# Patient Record
Sex: Male | Born: 1935
Health system: Southern US, Community
[De-identification: ages and names within clinical notes are randomized; demographics above are authoritative.]

## PROBLEM LIST (undated history)

## (undated) DIAGNOSIS — I10 Essential (primary) hypertension: Secondary | ICD-10-CM

## (undated) DIAGNOSIS — Z8673 Personal history of transient ischemic attack (TIA), and cerebral infarction without residual deficits: Secondary | ICD-10-CM

## (undated) DIAGNOSIS — E785 Hyperlipidemia, unspecified: Secondary | ICD-10-CM

## (undated) DIAGNOSIS — I779 Disorder of arteries and arterioles, unspecified: Secondary | ICD-10-CM

## (undated) DIAGNOSIS — C649 Malignant neoplasm of unspecified kidney, except renal pelvis: Secondary | ICD-10-CM

## (undated) DIAGNOSIS — I2510 Atherosclerotic heart disease of native coronary artery without angina pectoris: Secondary | ICD-10-CM

## (undated) HISTORY — PX: CORONARY ARTERY BYPASS GRAFT: SHX141

## (undated) HISTORY — DX: Disorder of arteries and arterioles, unspecified: I77.9

## (undated) HISTORY — PX: CAROTID ENDARTERECTOMY: SUR193

## (undated) HISTORY — DX: Essential (primary) hypertension: I10

## (undated) HISTORY — DX: Personal history of transient ischemic attack (TIA), and cerebral infarction without residual deficits: Z86.73

## (undated) HISTORY — DX: Hyperlipidemia, unspecified: E78.5

## (undated) HISTORY — DX: Atherosclerotic heart disease of native coronary artery without angina pectoris: I25.10

## (undated) HISTORY — DX: Malignant neoplasm of unspecified kidney, except renal pelvis: C64.9

## (undated) HISTORY — PX: NEPHRECTOMY: SHX65

---

## 1997-09-03 ENCOUNTER — Encounter (HOSPITAL_COMMUNITY): Admission: RE | Admit: 1997-09-03 | Discharge: 1997-12-02 | Payer: Self-pay | Admitting: *Deleted

## 1998-01-07 ENCOUNTER — Encounter (HOSPITAL_COMMUNITY): Admission: RE | Admit: 1998-01-07 | Discharge: 1998-04-07 | Payer: Self-pay | Admitting: *Deleted

## 2000-06-17 ENCOUNTER — Ambulatory Visit (HOSPITAL_COMMUNITY): Admission: RE | Admit: 2000-06-17 | Discharge: 2000-06-17 | Payer: Self-pay | Admitting: Family Medicine

## 2000-07-05 ENCOUNTER — Encounter: Payer: Self-pay | Admitting: Vascular Surgery

## 2000-07-07 ENCOUNTER — Inpatient Hospital Stay (HOSPITAL_COMMUNITY): Admission: RE | Admit: 2000-07-07 | Discharge: 2000-07-08 | Payer: Self-pay | Admitting: Vascular Surgery

## 2000-07-07 ENCOUNTER — Encounter (INDEPENDENT_AMBULATORY_CARE_PROVIDER_SITE_OTHER): Payer: Self-pay | Admitting: Specialist

## 2000-11-11 ENCOUNTER — Inpatient Hospital Stay (HOSPITAL_COMMUNITY): Admission: RE | Admit: 2000-11-11 | Discharge: 2000-11-15 | Payer: Self-pay | Admitting: Specialist

## 2003-05-24 ENCOUNTER — Inpatient Hospital Stay (HOSPITAL_COMMUNITY): Admission: EM | Admit: 2003-05-24 | Discharge: 2003-05-31 | Payer: Self-pay | Admitting: *Deleted

## 2003-06-03 ENCOUNTER — Inpatient Hospital Stay (HOSPITAL_COMMUNITY): Admission: RE | Admit: 2003-06-03 | Discharge: 2003-06-07 | Payer: Self-pay | Admitting: Urology

## 2003-06-04 ENCOUNTER — Encounter (INDEPENDENT_AMBULATORY_CARE_PROVIDER_SITE_OTHER): Payer: Self-pay | Admitting: Specialist

## 2003-09-13 ENCOUNTER — Ambulatory Visit (HOSPITAL_COMMUNITY): Admission: RE | Admit: 2003-09-13 | Discharge: 2003-09-13 | Payer: Self-pay | Admitting: General Surgery

## 2003-11-12 ENCOUNTER — Ambulatory Visit (HOSPITAL_COMMUNITY): Admission: RE | Admit: 2003-11-12 | Discharge: 2003-11-12 | Payer: Self-pay | Admitting: Urology

## 2003-12-03 ENCOUNTER — Ambulatory Visit: Admission: RE | Admit: 2003-12-03 | Discharge: 2003-12-25 | Payer: Self-pay | Admitting: Radiation Oncology

## 2004-02-24 ENCOUNTER — Ambulatory Visit: Admission: RE | Admit: 2004-02-24 | Discharge: 2004-05-08 | Payer: Self-pay | Admitting: Radiation Oncology

## 2004-03-03 ENCOUNTER — Encounter: Admission: RE | Admit: 2004-03-03 | Discharge: 2004-03-03 | Payer: Self-pay | Admitting: Urology

## 2004-03-30 ENCOUNTER — Ambulatory Visit (HOSPITAL_BASED_OUTPATIENT_CLINIC_OR_DEPARTMENT_OTHER): Admission: RE | Admit: 2004-03-30 | Discharge: 2004-03-30 | Payer: Self-pay

## 2004-03-30 ENCOUNTER — Ambulatory Visit (HOSPITAL_COMMUNITY): Admission: RE | Admit: 2004-03-30 | Discharge: 2004-03-30 | Payer: Self-pay

## 2004-04-20 ENCOUNTER — Ambulatory Visit: Admission: RE | Admit: 2004-04-20 | Discharge: 2004-04-20 | Payer: Self-pay | Admitting: Radiation Oncology

## 2004-08-29 ENCOUNTER — Ambulatory Visit (HOSPITAL_COMMUNITY): Admission: RE | Admit: 2004-08-29 | Discharge: 2004-08-29 | Payer: Self-pay | Admitting: Urology

## 2005-07-04 IMAGING — CR DG ABDOMEN ACUTE W/ 1V CHEST
3 series · 3 of 3 positions shown · non-contrast
Comparison: none

CLINICAL DATA: Right upper and lower abdominal pain and nausea and vomiting.
 ACUTE ABDOMEN WITH CHEST, 05/24/03
 Comparison chest dated 07/05/00.
 Stable borderline enlargement of the cardiac silhouette with a prominent left ventricular contour.  Stable mild prominence of the pulmonary vasculature and interstitial markings.  Stable mild elevation of the right hemidiaphragm.  Normal bowel gas pattern without free peritoneal air.  Mild scoliosis.  Lumbar spine degenerative changes.
 IMPRESSION 
 Stable borderline cardiomegaly, probable left ventricular hypertrophy, and mild pulmonary vascular congestion.
 Stable mild chronic interstitial lung disease and mild elevation of the right hemidiaphragm.
 No acute abnormality.

[view not recorded (1 of 3)]
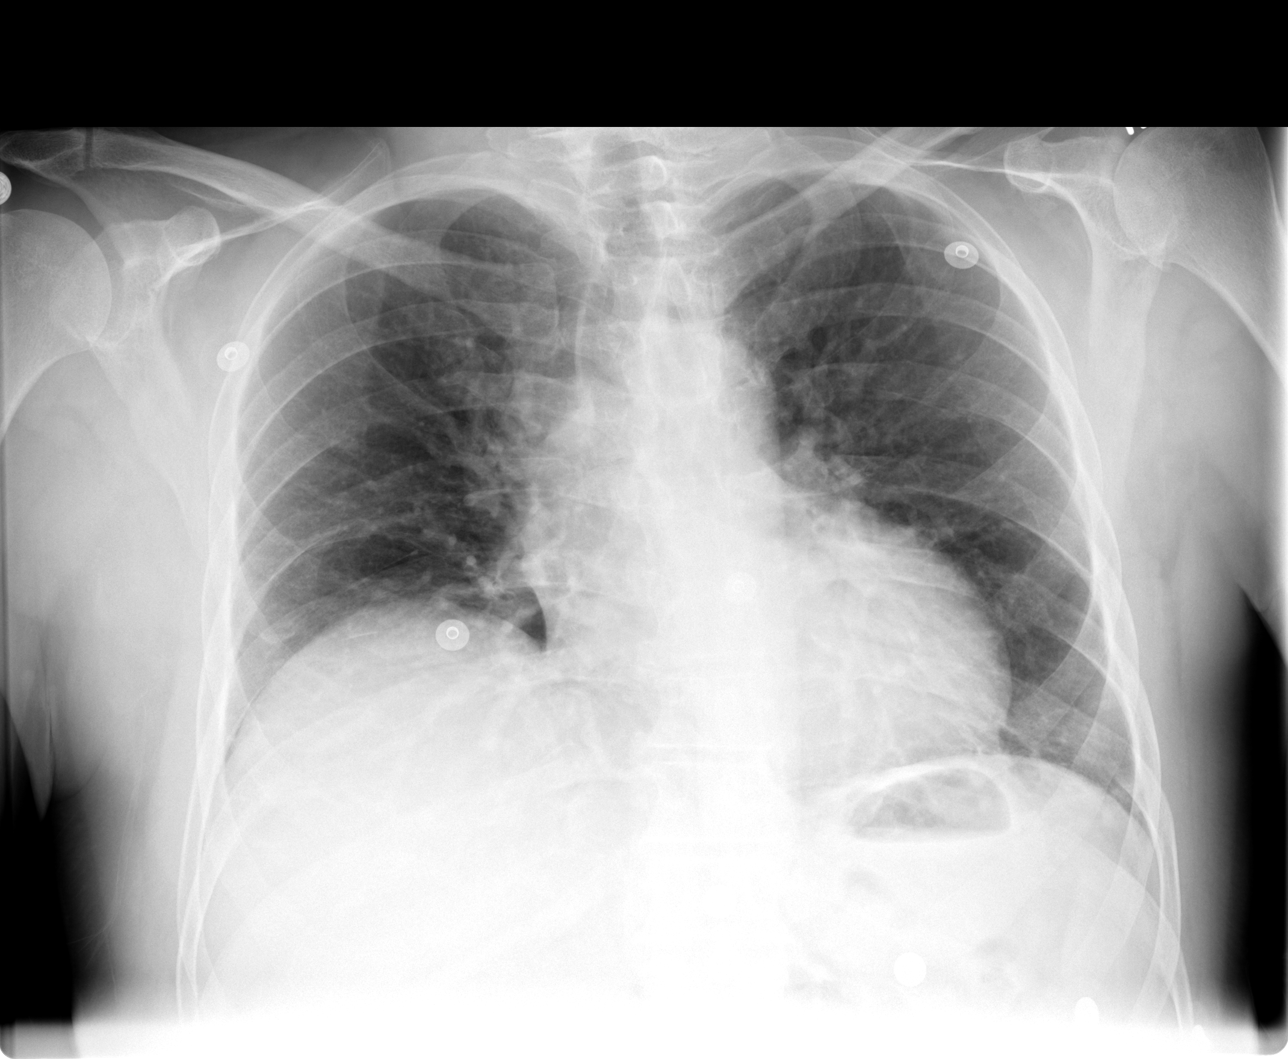

[view not recorded (2 of 3)]
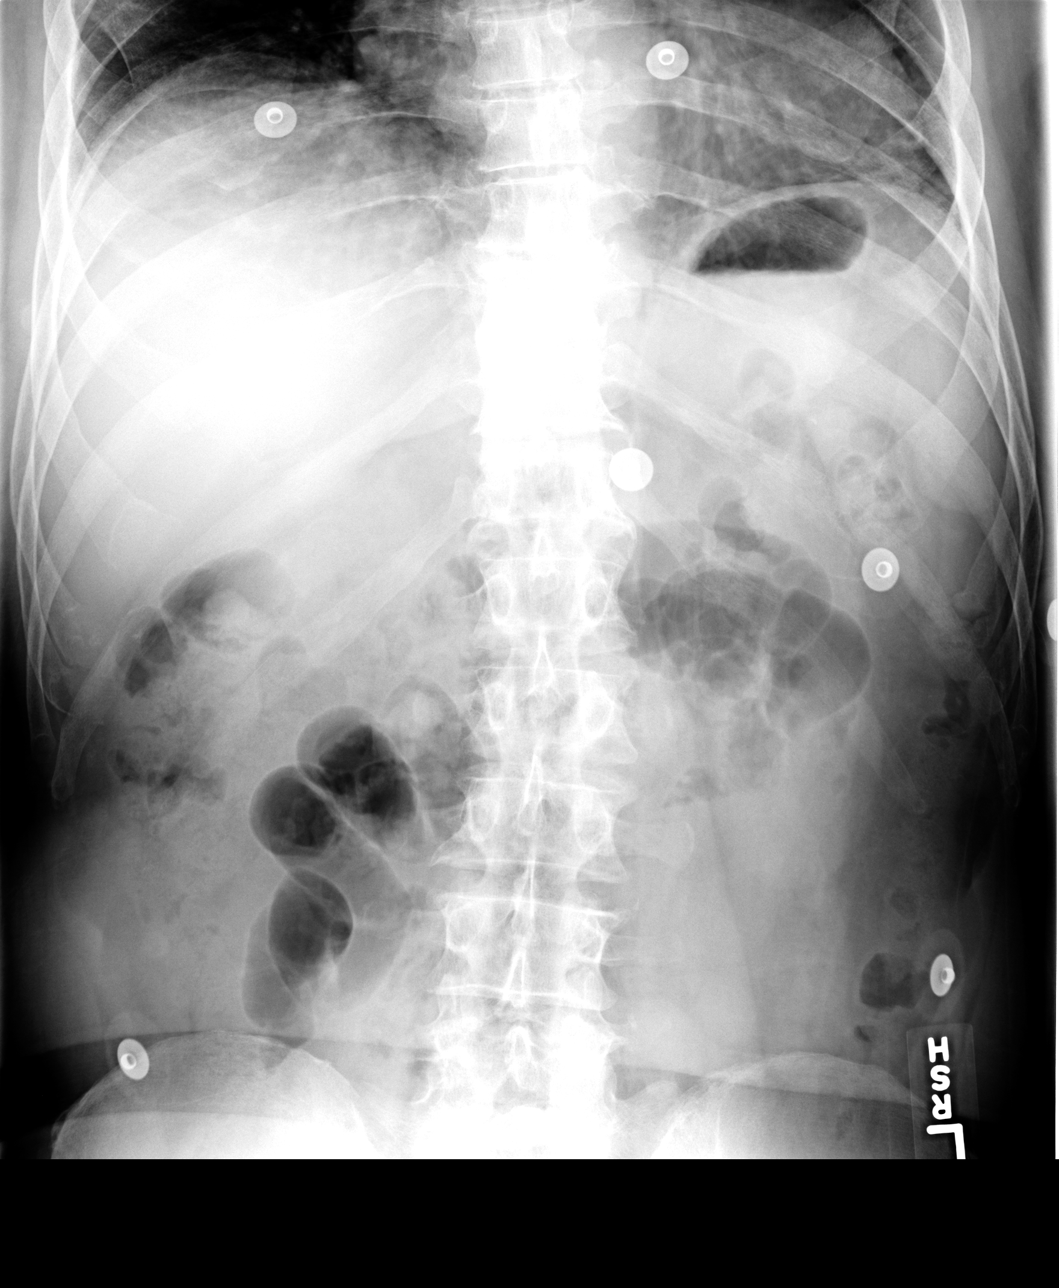

[view not recorded (3 of 3)]
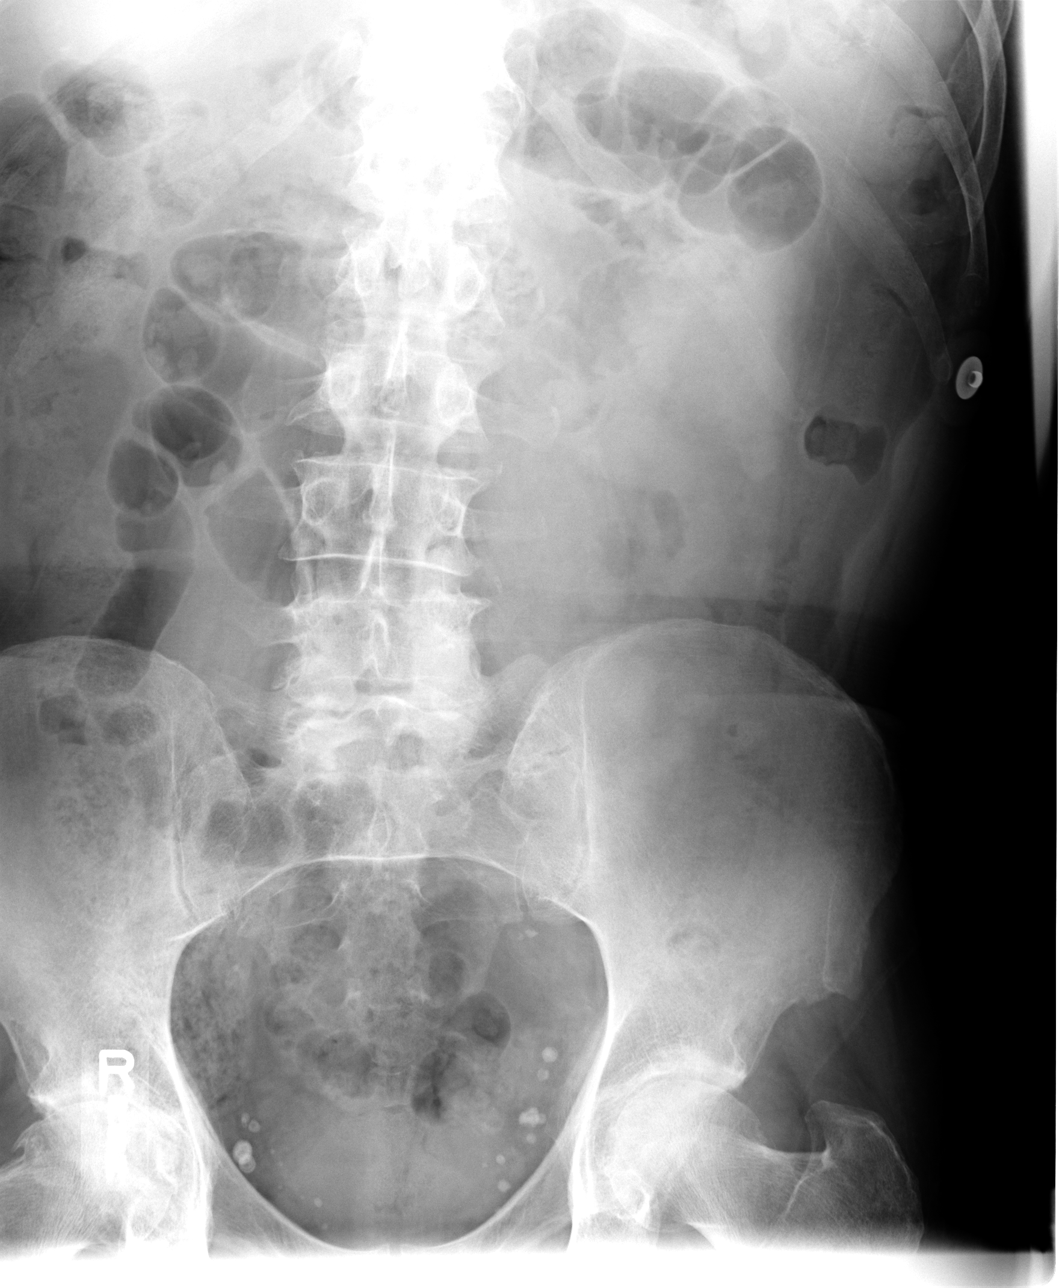

[3 of 3 positions shown; findings below may reference images not displayed]

## 2005-09-15 ENCOUNTER — Ambulatory Visit (HOSPITAL_COMMUNITY): Admission: RE | Admit: 2005-09-15 | Discharge: 2005-09-15 | Payer: Self-pay | Admitting: Urology

## 2008-03-15 ENCOUNTER — Inpatient Hospital Stay (HOSPITAL_COMMUNITY): Admission: EM | Admit: 2008-03-15 | Discharge: 2008-03-19 | Payer: Self-pay | Admitting: Emergency Medicine

## 2008-03-18 ENCOUNTER — Encounter: Payer: Self-pay | Admitting: Cardiology

## 2008-03-18 ENCOUNTER — Encounter (INDEPENDENT_AMBULATORY_CARE_PROVIDER_SITE_OTHER): Payer: Self-pay | Admitting: Internal Medicine

## 2008-04-01 ENCOUNTER — Encounter: Admission: RE | Admit: 2008-04-01 | Discharge: 2008-04-01 | Payer: Self-pay | Admitting: Family Medicine

## 2008-04-05 ENCOUNTER — Encounter: Payer: Self-pay | Admitting: Cardiovascular Disease

## 2008-04-08 ENCOUNTER — Ambulatory Visit (HOSPITAL_COMMUNITY): Admission: RE | Admit: 2008-04-08 | Discharge: 2008-04-08 | Payer: Self-pay | Admitting: Cardiovascular Disease

## 2008-04-09 ENCOUNTER — Ambulatory Visit: Payer: Self-pay | Admitting: Surgery

## 2008-04-15 ENCOUNTER — Encounter: Payer: Self-pay | Admitting: Surgery

## 2008-04-15 ENCOUNTER — Ambulatory Visit (HOSPITAL_COMMUNITY): Admission: RE | Admit: 2008-04-15 | Discharge: 2008-04-15 | Payer: Self-pay | Admitting: Surgery

## 2008-04-17 ENCOUNTER — Inpatient Hospital Stay (HOSPITAL_COMMUNITY): Admission: RE | Admit: 2008-04-17 | Discharge: 2008-04-22 | Payer: Self-pay | Admitting: Surgery

## 2008-04-17 ENCOUNTER — Ambulatory Visit: Payer: Self-pay | Admitting: Surgery

## 2008-04-26 ENCOUNTER — Encounter: Payer: Self-pay | Admitting: Cardiovascular Disease

## 2008-05-09 ENCOUNTER — Ambulatory Visit: Payer: Self-pay | Admitting: Surgery

## 2008-05-09 ENCOUNTER — Encounter: Admission: RE | Admit: 2008-05-09 | Discharge: 2008-05-09 | Payer: Self-pay | Admitting: Surgery

## 2009-10-21 ENCOUNTER — Ambulatory Visit: Payer: Self-pay | Admitting: Cardiovascular Disease

## 2010-05-01 ENCOUNTER — Ambulatory Visit: Payer: Self-pay | Admitting: Cardiovascular Disease

## 2010-05-27 IMAGING — CR DG CHEST 2V
2 series · 2 of 2 positions shown · non-contrast
Comparison: 09/15/2005

CLINICAL DATA: Coronary artery disease, hypertension, preoperative
assessment for CABG.

CHEST - 2 VIEW

[view not recorded (1 of 2)]
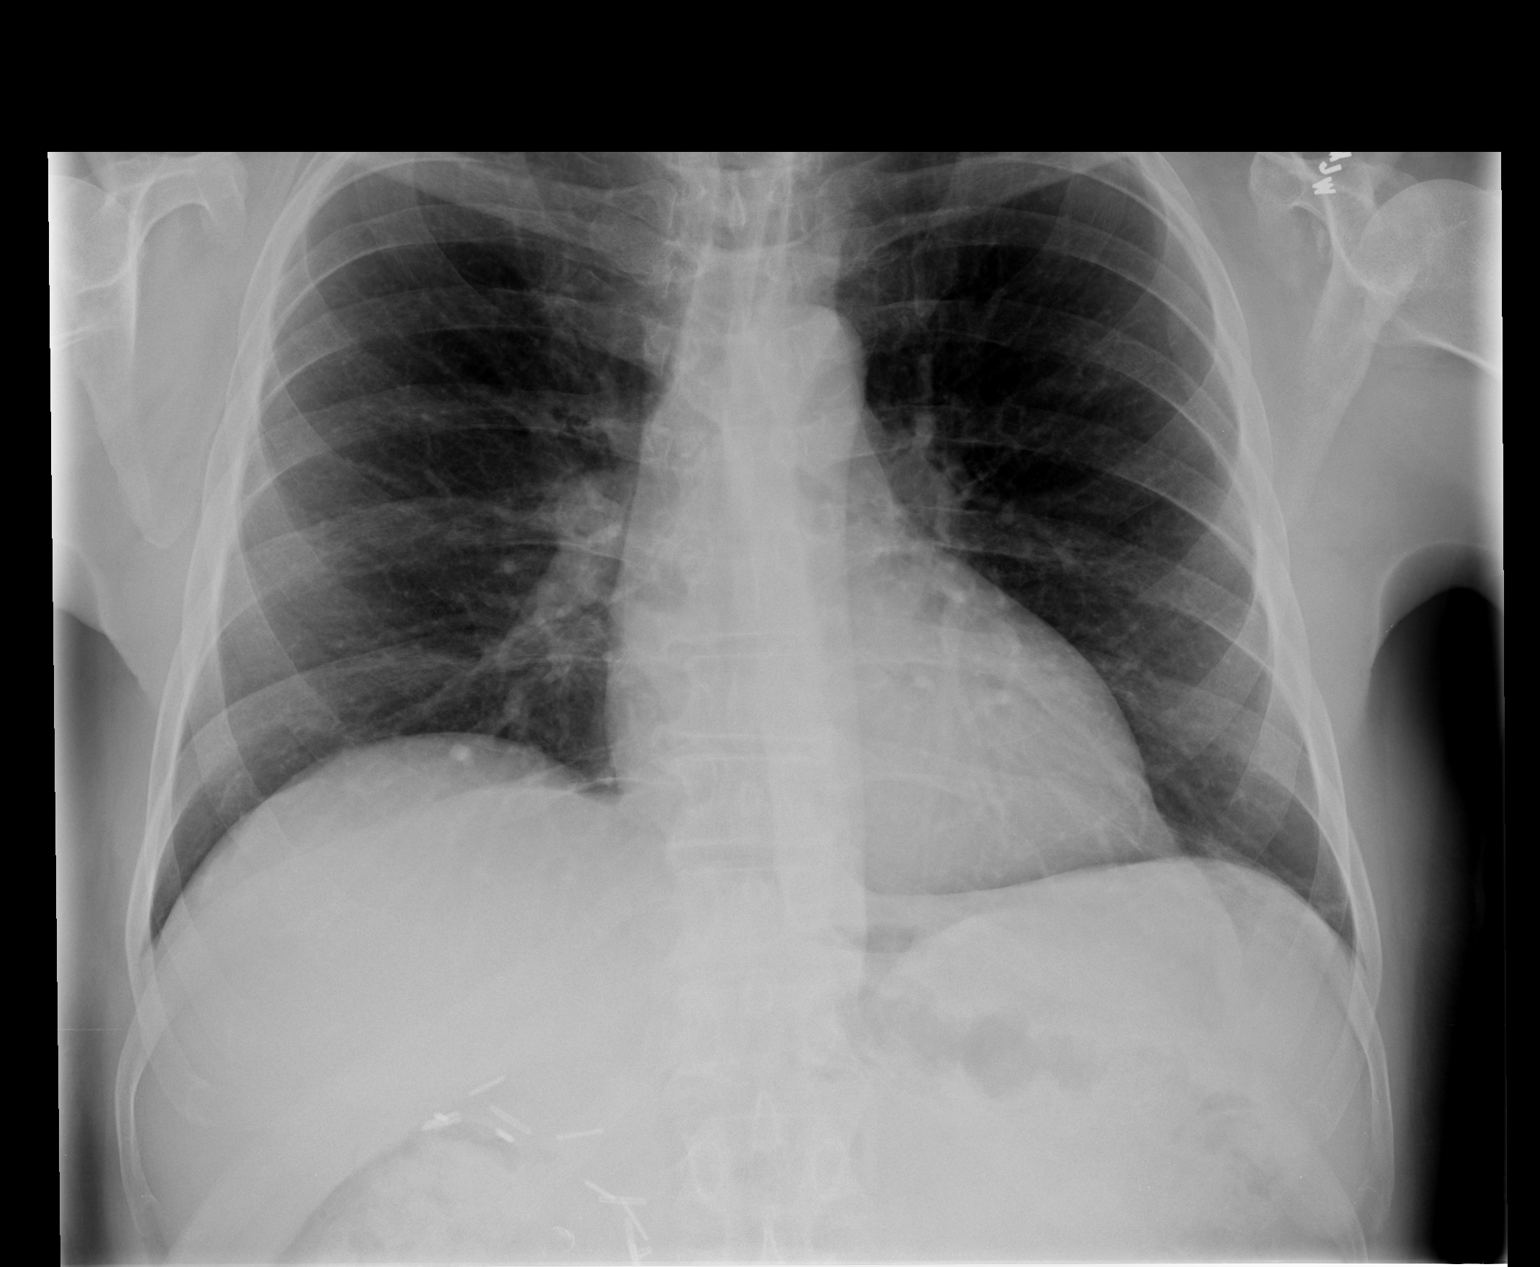

[view not recorded (2 of 2)]
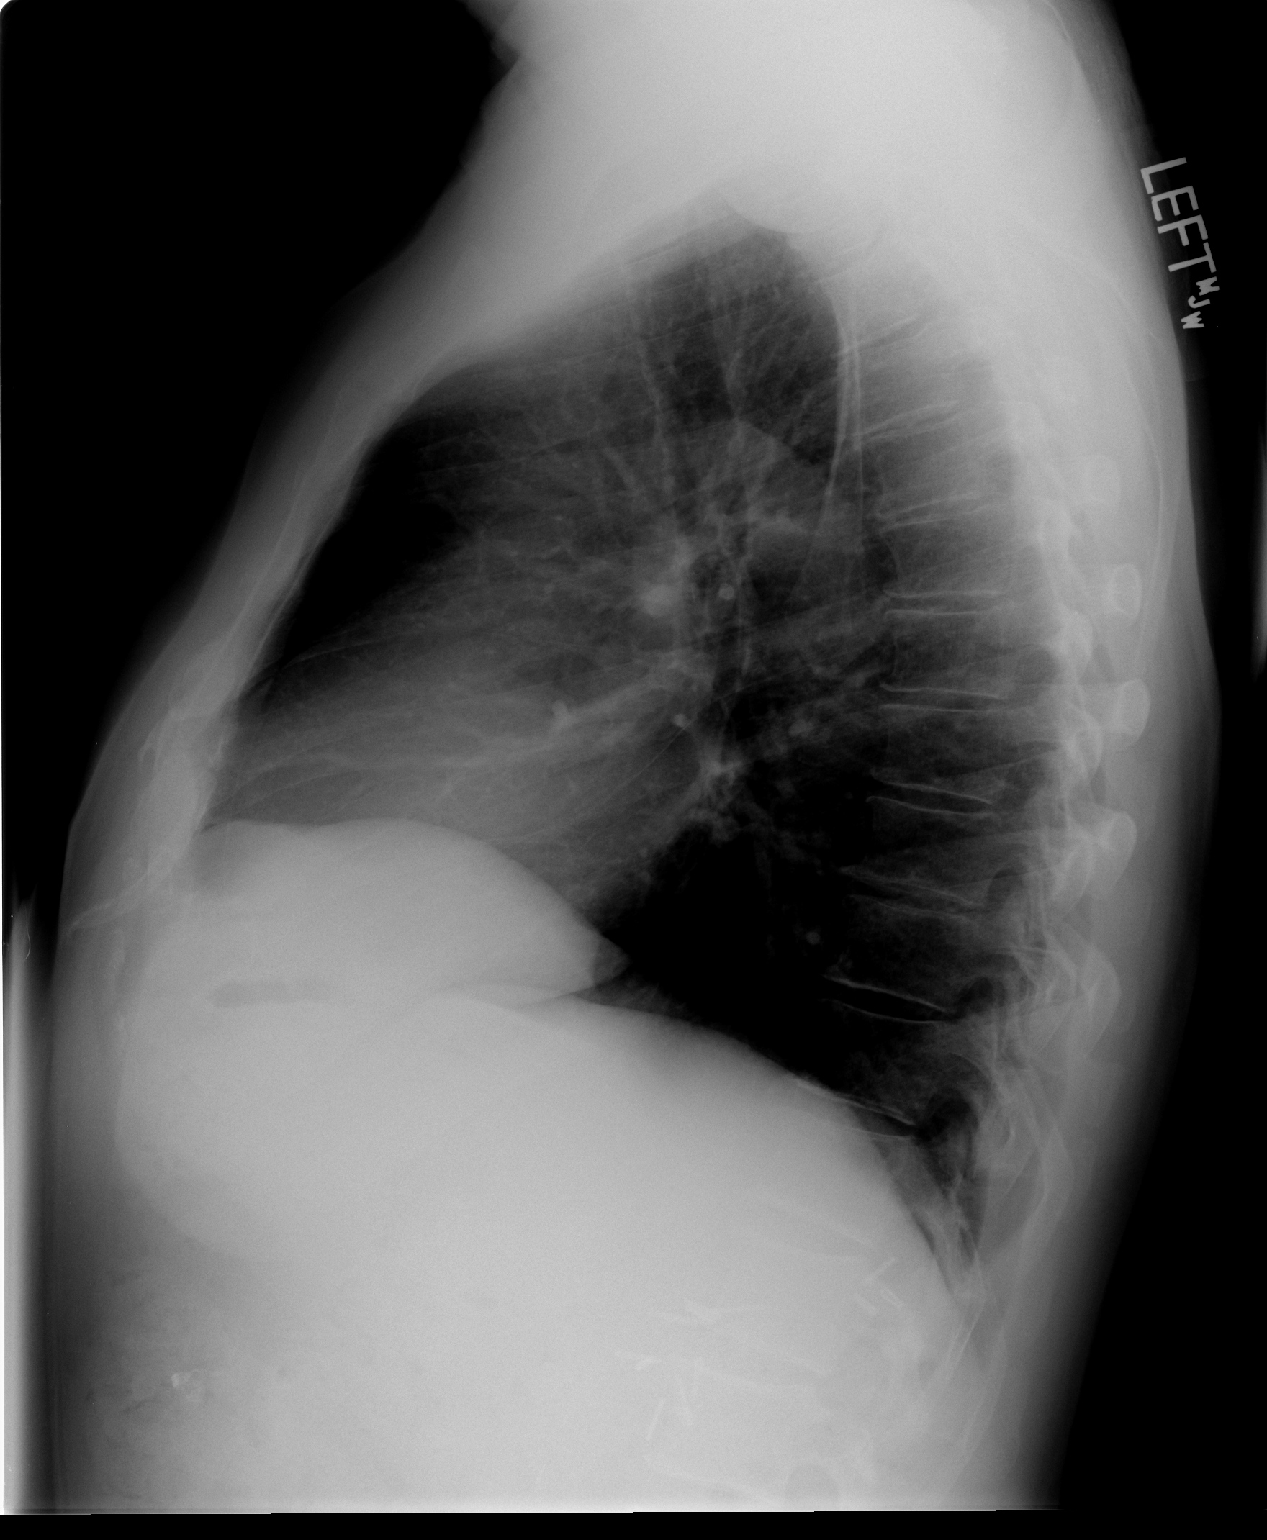

[2 of 2 positions shown; findings below may reference images not displayed]

FINDINGS: Normal heart size, mediastinal contours, and pulmonary vascularity.
Lungs clear.
No pleural effusion or pneumothorax.
Bones unremarkable.
IMPRESSION: No acute abnormalities.

## 2010-05-28 LAB — POCT I-STAT 4, (NA,K, GLUC, HGB,HCT)
Glucose, Bld: 110 mg/dL — ABNORMAL HIGH (ref 70–99)
Glucose, Bld: 95 mg/dL (ref 70–99)
HCT: 25 % — ABNORMAL LOW (ref 39.0–52.0)
HCT: 28 % — ABNORMAL LOW (ref 39.0–52.0)
HCT: 33 % — ABNORMAL LOW (ref 39.0–52.0)
HCT: 36 % — ABNORMAL LOW (ref 39.0–52.0)
Hemoglobin: 11.2 g/dL — ABNORMAL LOW (ref 13.0–17.0)
Hemoglobin: 12.2 g/dL — ABNORMAL LOW (ref 13.0–17.0)
Hemoglobin: 8.5 g/dL — ABNORMAL LOW (ref 13.0–17.0)
Hemoglobin: 9.5 g/dL — ABNORMAL LOW (ref 13.0–17.0)
Sodium: 139 mEq/L (ref 135–145)
Sodium: 141 mEq/L (ref 135–145)
Sodium: 142 mEq/L (ref 135–145)
Sodium: 144 mEq/L (ref 135–145)

## 2010-05-28 LAB — CBC
HCT: 23.8 % — ABNORMAL LOW (ref 39.0–52.0)
HCT: 26.4 % — ABNORMAL LOW (ref 39.0–52.0)
HCT: 27.3 % — ABNORMAL LOW (ref 39.0–52.0)
HCT: 27.9 % — ABNORMAL LOW (ref 39.0–52.0)
HCT: 33.2 % — ABNORMAL LOW (ref 39.0–52.0)
Hemoglobin: 11.3 g/dL — ABNORMAL LOW (ref 13.0–17.0)
Hemoglobin: 8.3 g/dL — ABNORMAL LOW (ref 13.0–17.0)
Hemoglobin: 9.1 g/dL — ABNORMAL LOW (ref 13.0–17.0)
Hemoglobin: 9.4 g/dL — ABNORMAL LOW (ref 13.0–17.0)
Hemoglobin: 9.7 g/dL — ABNORMAL LOW (ref 13.0–17.0)
Hemoglobin: 9.9 g/dL — ABNORMAL LOW (ref 13.0–17.0)
MCHC: 34 g/dL (ref 30.0–36.0)
MCHC: 34.6 g/dL (ref 30.0–36.0)
MCHC: 34.6 g/dL (ref 30.0–36.0)
MCHC: 34.6 g/dL (ref 30.0–36.0)
MCHC: 34.7 g/dL (ref 30.0–36.0)
MCV: 91.6 fL (ref 78.0–100.0)
MCV: 91.7 fL (ref 78.0–100.0)
MCV: 92.3 fL (ref 78.0–100.0)
MCV: 92.6 fL (ref 78.0–100.0)
MCV: 92.9 fL (ref 78.0–100.0)
MCV: 93.7 fL (ref 78.0–100.0)
MCV: 94.1 fL (ref 78.0–100.0)
Platelets: 120 10*3/uL — ABNORMAL LOW (ref 150–400)
Platelets: 134 10*3/uL — ABNORMAL LOW (ref 150–400)
Platelets: 195 10*3/uL (ref 150–400)
RBC: 2.88 MIL/uL — ABNORMAL LOW (ref 4.22–5.81)
RBC: 2.95 MIL/uL — ABNORMAL LOW (ref 4.22–5.81)
RBC: 3.05 MIL/uL — ABNORMAL LOW (ref 4.22–5.81)
RBC: 3.06 MIL/uL — ABNORMAL LOW (ref 4.22–5.81)
RBC: 3.52 MIL/uL — ABNORMAL LOW (ref 4.22–5.81)
RDW: 14.3 % (ref 11.5–15.5)
RDW: 14.6 % (ref 11.5–15.5)
RDW: 14.6 % (ref 11.5–15.5)
RDW: 14.6 % (ref 11.5–15.5)
RDW: 14.7 % (ref 11.5–15.5)
RDW: 15.5 % (ref 11.5–15.5)
WBC: 7.1 10*3/uL (ref 4.0–10.5)
WBC: 9.4 10*3/uL (ref 4.0–10.5)
WBC: 9.9 10*3/uL (ref 4.0–10.5)

## 2010-05-28 LAB — CREATININE, SERUM
Creatinine, Ser: 1.07 mg/dL (ref 0.4–1.5)
GFR calc Af Amer: 51 mL/min — ABNORMAL LOW (ref >60)
GFR calc Af Amer: >60 mL/min (ref >60)
GFR calc non Af Amer: >60 mL/min (ref >60)

## 2010-05-28 LAB — POCT I-STAT 3, ART BLOOD GAS (G3+)
Acid-base deficit: 3 mmol/L — ABNORMAL HIGH (ref 0.0–2.0)
Bicarbonate: 22.3 mEq/L (ref 20.0–24.0)
Bicarbonate: 23.2 mEq/L (ref 20.0–24.0)
O2 Saturation: 100 %
O2 Saturation: 100 %
O2 Saturation: 98 %
Patient temperature: 35.8
TCO2: 24 mmol/L (ref 0–100)
TCO2: 25 mmol/L (ref 0–100)
pCO2 arterial: 26.1 mmHg — ABNORMAL LOW (ref 35.0–45.0)
pCO2 arterial: 35.2 mmHg (ref 35.0–45.0)
pCO2 arterial: 36.4 mmHg (ref 35.0–45.0)
pH, Arterial: 7.363 (ref 7.350–7.450)
pH, Arterial: 7.397 (ref 7.350–7.450)
pH, Arterial: 7.445 (ref 7.350–7.450)
pO2, Arterial: 109 mmHg — ABNORMAL HIGH (ref 80.0–100.0)
pO2, Arterial: 174 mmHg — ABNORMAL HIGH (ref 80.0–100.0)
pO2, Arterial: 248 mmHg — ABNORMAL HIGH (ref 80.0–100.0)

## 2010-05-28 LAB — HEMOGLOBIN AND HEMATOCRIT, BLOOD
HCT: 23.4 % — ABNORMAL LOW (ref 39.0–52.0)
Hemoglobin: 8.3 g/dL — ABNORMAL LOW (ref 13.0–17.0)

## 2010-05-28 LAB — COMPREHENSIVE METABOLIC PANEL
ALT: 20 U/L (ref 0–53)
Alkaline Phosphatase: 62 U/L (ref 39–117)
BUN: 20 mg/dL (ref 6–23)
CO2: 23 mEq/L (ref 19–32)
Calcium: 9.3 mg/dL (ref 8.4–10.5)
GFR calc Af Amer: >60 mL/min (ref >60)
GFR calc non Af Amer: >60 mL/min (ref >60)
Glucose, Bld: 77 mg/dL (ref 70–99)
Sodium: 140 mEq/L (ref 135–145)

## 2010-05-28 LAB — MAGNESIUM
Magnesium: 2.9 mg/dL — ABNORMAL HIGH (ref 1.5–2.5)
Magnesium: 3.1 mg/dL — ABNORMAL HIGH (ref 1.5–2.5)

## 2010-05-28 LAB — BASIC METABOLIC PANEL
BUN: 21 mg/dL (ref 6–23)
CO2: 28 mEq/L (ref 19–32)
CO2: 29 mEq/L (ref 19–32)
CO2: 29 mEq/L (ref 19–32)
Calcium: 8.5 mg/dL (ref 8.4–10.5)
Chloride: 104 mEq/L (ref 96–112)
Chloride: 105 mEq/L (ref 96–112)
Chloride: 107 mEq/L (ref 96–112)
Chloride: 107 mEq/L (ref 96–112)
Creatinine, Ser: 1.2 mg/dL (ref 0.4–1.5)
GFR calc Af Amer: >60 mL/min (ref >60)
GFR calc Af Amer: >60 mL/min (ref >60)
GFR calc Af Amer: >60 mL/min (ref >60)
GFR calc non Af Amer: 57 mL/min — ABNORMAL LOW (ref >60)
GFR calc non Af Amer: 60 mL/min — ABNORMAL LOW (ref >60)
Glucose, Bld: 122 mg/dL — ABNORMAL HIGH (ref 70–99)
Glucose, Bld: 61 mg/dL — ABNORMAL LOW (ref 70–99)
Glucose, Bld: 77 mg/dL (ref 70–99)
Glucose, Bld: 80 mg/dL (ref 70–99)
Potassium: 3.6 mEq/L (ref 3.5–5.1)
Potassium: 3.8 mEq/L (ref 3.5–5.1)
Potassium: 4.3 mEq/L (ref 3.5–5.1)
Sodium: 136 mEq/L (ref 135–145)
Sodium: 137 mEq/L (ref 135–145)
Sodium: 139 mEq/L (ref 135–145)
Sodium: 140 mEq/L (ref 135–145)

## 2010-05-28 LAB — URINALYSIS, ROUTINE W REFLEX MICROSCOPIC
Bilirubin Urine: NEGATIVE
Ketones, ur: NEGATIVE mg/dL
Nitrite: NEGATIVE
Protein, ur: NEGATIVE mg/dL
Urobilinogen, UA: 0.2 mg/dL (ref 0.0–1.0)

## 2010-05-28 LAB — GLUCOSE, CAPILLARY
Glucose-Capillary: 100 mg/dL — ABNORMAL HIGH (ref 70–99)
Glucose-Capillary: 101 mg/dL — ABNORMAL HIGH (ref 70–99)
Glucose-Capillary: 112 mg/dL — ABNORMAL HIGH (ref 70–99)
Glucose-Capillary: 117 mg/dL — ABNORMAL HIGH (ref 70–99)
Glucose-Capillary: 126 mg/dL — ABNORMAL HIGH (ref 70–99)
Glucose-Capillary: 127 mg/dL — ABNORMAL HIGH (ref 70–99)
Glucose-Capillary: 130 mg/dL — ABNORMAL HIGH (ref 70–99)
Glucose-Capillary: 140 mg/dL — ABNORMAL HIGH (ref 70–99)
Glucose-Capillary: 148 mg/dL — ABNORMAL HIGH (ref 70–99)
Glucose-Capillary: 61 mg/dL — ABNORMAL LOW (ref 70–99)
Glucose-Capillary: 81 mg/dL (ref 70–99)
Glucose-Capillary: 91 mg/dL (ref 70–99)

## 2010-05-28 LAB — HEMOGLOBIN A1C: Hgb A1c MFr Bld: 8.6 % — ABNORMAL HIGH (ref 4.6–6.1)

## 2010-05-28 LAB — POCT I-STAT, CHEM 8
BUN: 21 mg/dL (ref 6–23)
Calcium, Ion: 1.18 mmol/L (ref 1.12–1.32)
Chloride: 104 mEq/L (ref 96–112)
Creatinine, Ser: 1.1 mg/dL (ref 0.4–1.5)
Creatinine, Ser: 1.6 mg/dL — ABNORMAL HIGH (ref 0.4–1.5)
Glucose, Bld: 145 mg/dL — ABNORMAL HIGH (ref 70–99)
HCT: 28 % — ABNORMAL LOW (ref 39.0–52.0)
Hemoglobin: 9.5 g/dL — ABNORMAL LOW (ref 13.0–17.0)
Potassium: 4.3 mEq/L (ref 3.5–5.1)
Sodium: 141 mEq/L (ref 135–145)
TCO2: 21 mmol/L (ref 0–100)
TCO2: 25 mmol/L (ref 0–100)

## 2010-05-28 LAB — TYPE AND SCREEN
ABO/RH(D): O NEG
Antibody Screen: NEGATIVE

## 2010-05-28 LAB — BLOOD GAS, ARTERIAL
Bicarbonate: 23.4 mEq/L (ref 20.0–24.0)
Drawn by: 181601
FIO2: 0.21 %
Patient temperature: 98.6
pH, Arterial: 7.419 (ref 7.350–7.450)

## 2010-05-28 LAB — ABO/RH: ABO/RH(D): O NEG

## 2010-05-28 LAB — POCT I-STAT 3, VENOUS BLOOD GAS (G3P V)
Acid-base deficit: 2 mmol/L (ref 0.0–2.0)
Bicarbonate: 23.7 mEq/L (ref 20.0–24.0)
pCO2, Ven: 31.3 mmHg — ABNORMAL LOW (ref 45.0–50.0)
pO2, Ven: 29 mmHg — CL (ref 30.0–45.0)

## 2010-05-28 LAB — PLATELET COUNT: Platelets: 139 10*3/uL — ABNORMAL LOW (ref 150–400)

## 2010-05-28 LAB — APTT: aPTT: 29 seconds (ref 24–37)

## 2010-05-28 LAB — PROTIME-INR
INR: 1 (ref 0.00–1.49)
Prothrombin Time: 12.9 seconds (ref 11.6–15.2)

## 2010-06-01 LAB — HEPATIC FUNCTION PANEL
Bilirubin, Direct: 0.1 mg/dL (ref 0.0–0.3)
Indirect Bilirubin: 1.6 mg/dL — ABNORMAL HIGH (ref 0.3–0.9)

## 2010-06-01 LAB — CK TOTAL AND CKMB (NOT AT ARMC)
CK, MB: 6.1 ng/mL — ABNORMAL HIGH (ref 0.3–4.0)
Relative Index: INVALID (ref 0.0–2.5)
Relative Index: INVALID (ref 0.0–2.5)
Total CK: 94 U/L (ref 7–232)

## 2010-06-01 LAB — BASIC METABOLIC PANEL
BUN: 37 mg/dL — ABNORMAL HIGH (ref 6–23)
BUN: 48 mg/dL — ABNORMAL HIGH (ref 6–23)
CO2: 26 mEq/L (ref 19–32)
Chloride: 119 mEq/L — ABNORMAL HIGH (ref 96–112)
Creatinine, Ser: 2.02 mg/dL — ABNORMAL HIGH (ref 0.4–1.5)
GFR calc Af Amer: 40 mL/min — ABNORMAL LOW (ref >60)
GFR calc Af Amer: 53 mL/min — ABNORMAL LOW (ref >60)
GFR calc non Af Amer: 44 mL/min — ABNORMAL LOW (ref >60)
GFR calc non Af Amer: 48 mL/min — ABNORMAL LOW (ref >60)
Glucose, Bld: 112 mg/dL — ABNORMAL HIGH (ref 70–99)
Potassium: 4.3 mEq/L (ref 3.5–5.1)
Potassium: 4.4 mEq/L (ref 3.5–5.1)
Sodium: 144 mEq/L (ref 135–145)
Sodium: 146 mEq/L — ABNORMAL HIGH (ref 135–145)

## 2010-06-01 LAB — POCT I-STAT, CHEM 8
BUN: 99 mg/dL — ABNORMAL HIGH (ref 6–23)
Calcium, Ion: 1.05 mmol/L — ABNORMAL LOW (ref 1.12–1.32)
Creatinine, Ser: 2.7 mg/dL — ABNORMAL HIGH (ref 0.4–1.5)
TCO2: 15 mmol/L (ref 0–100)

## 2010-06-01 LAB — DIFFERENTIAL
Basophils Absolute: 0 10*3/uL (ref 0.0–0.1)
Basophils Relative: 0 % (ref 0–1)
Basophils Relative: 1 % (ref 0–1)
Eosinophils Absolute: 0 10*3/uL (ref 0.0–0.7)
Eosinophils Relative: 0 % (ref 0–5)
Eosinophils Relative: 0 % (ref 0–5)
Lymphocytes Relative: 13 % (ref 12–46)
Lymphocytes Relative: 23 % (ref 12–46)
Monocytes Absolute: 0.7 10*3/uL (ref 0.1–1.0)
Monocytes Absolute: 1.2 10*3/uL — ABNORMAL HIGH (ref 0.1–1.0)
Monocytes Relative: 6 % (ref 3–12)
Monocytes Relative: 9 % (ref 3–12)
Neutro Abs: 10.3 10*3/uL — ABNORMAL HIGH (ref 1.7–7.7)
Neutro Abs: 11 10*3/uL — ABNORMAL HIGH (ref 1.7–7.7)

## 2010-06-01 LAB — GLUCOSE, CAPILLARY
Glucose-Capillary: 102 mg/dL — ABNORMAL HIGH (ref 70–99)
Glucose-Capillary: 107 mg/dL — ABNORMAL HIGH (ref 70–99)
Glucose-Capillary: 125 mg/dL — ABNORMAL HIGH (ref 70–99)
Glucose-Capillary: 146 mg/dL — ABNORMAL HIGH (ref 70–99)
Glucose-Capillary: 208 mg/dL — ABNORMAL HIGH (ref 70–99)
Glucose-Capillary: 220 mg/dL — ABNORMAL HIGH (ref 70–99)
Glucose-Capillary: 273 mg/dL — ABNORMAL HIGH (ref 70–99)
Glucose-Capillary: 299 mg/dL — ABNORMAL HIGH (ref 70–99)
Glucose-Capillary: 302 mg/dL — ABNORMAL HIGH (ref 70–99)
Glucose-Capillary: 400 mg/dL — ABNORMAL HIGH (ref 70–99)
Glucose-Capillary: 413 mg/dL — ABNORMAL HIGH (ref 70–99)

## 2010-06-01 LAB — CBC
HCT: 35 % — ABNORMAL LOW (ref 39.0–52.0)
HCT: 52.7 % — ABNORMAL HIGH (ref 39.0–52.0)
Hemoglobin: 14.6 g/dL (ref 13.0–17.0)
Hemoglobin: 17.3 g/dL — ABNORMAL HIGH (ref 13.0–17.0)
MCHC: 32.8 g/dL (ref 30.0–36.0)
MCHC: 33.6 g/dL (ref 30.0–36.0)
MCV: 91.8 fL (ref 78.0–100.0)
MCV: 93.8 fL (ref 78.0–100.0)
Platelets: 143 10*3/uL — ABNORMAL LOW (ref 150–400)
RBC: 4.73 MIL/uL (ref 4.22–5.81)
RBC: 5.61 MIL/uL (ref 4.22–5.81)
RDW: 12.4 % (ref 11.5–15.5)
RDW: 12.9 % (ref 11.5–15.5)

## 2010-06-01 LAB — POCT CARDIAC MARKERS
Myoglobin, poc: >500 ng/mL (ref 12–200)
Troponin i, poc: <0.05 ng/mL (ref 0.00–0.09)

## 2010-06-01 LAB — CARDIAC PANEL(CRET KIN+CKTOT+MB+TROPI)
CK, MB: 3.2 ng/mL (ref 0.3–4.0)
Troponin I: 0.12 ng/mL — ABNORMAL HIGH (ref 0.00–0.06)

## 2010-06-01 LAB — URINALYSIS, ROUTINE W REFLEX MICROSCOPIC
Bilirubin Urine: NEGATIVE
Ketones, ur: 40 mg/dL — AB
Specific Gravity, Urine: 1.027 (ref 1.005–1.030)
Urobilinogen, UA: 0.2 mg/dL (ref 0.0–1.0)

## 2010-06-01 LAB — PHOSPHORUS: Phosphorus: 3.9 mg/dL (ref 2.3–4.6)

## 2010-06-01 LAB — AMYLASE: Amylase: 96 U/L (ref 27–131)

## 2010-06-01 LAB — MAGNESIUM: Magnesium: 4 mg/dL — ABNORMAL HIGH (ref 1.5–2.5)

## 2010-06-01 LAB — KETONES, QUALITATIVE

## 2010-06-02 LAB — GLUCOSE, CAPILLARY
Glucose-Capillary: 200 mg/dL — ABNORMAL HIGH (ref 70–99)
Glucose-Capillary: 286 mg/dL — ABNORMAL HIGH (ref 70–99)
Glucose-Capillary: 296 mg/dL — ABNORMAL HIGH (ref 70–99)
Glucose-Capillary: 331 mg/dL — ABNORMAL HIGH (ref 70–99)
Glucose-Capillary: 66 mg/dL — ABNORMAL LOW (ref 70–99)
Glucose-Capillary: 86 mg/dL (ref 70–99)

## 2010-06-02 LAB — BASIC METABOLIC PANEL
BUN: 23 mg/dL (ref 6–23)
CO2: 24 mEq/L (ref 19–32)
Calcium: 7.7 mg/dL — ABNORMAL LOW (ref 8.4–10.5)
Calcium: 7.8 mg/dL — ABNORMAL LOW (ref 8.4–10.5)
Creatinine, Ser: 1.16 mg/dL (ref 0.4–1.5)
GFR calc Af Amer: >60 mL/min (ref >60)
GFR calc non Af Amer: >60 mL/min (ref >60)
Glucose, Bld: 219 mg/dL — ABNORMAL HIGH (ref 70–99)
Potassium: 4 mEq/L (ref 3.5–5.1)

## 2010-06-02 LAB — HEPATIC FUNCTION PANEL
Albumin: 1.9 g/dL — ABNORMAL LOW (ref 3.5–5.2)
Total Protein: 4 g/dL — ABNORMAL LOW (ref 6.0–8.3)

## 2010-06-30 NOTE — Cardiovascular Report (Signed)
NAME:  [name], [name] NO.:  [id number]   MEDICAL RECORD NO.:  [id number]          PATIENT TYPE:  OIB   LOCATION:  2899                         FACILITY:  [facility]   PHYSICIAN:  Vesta Mixer, M.D. DATE OF BIRTH:  12-25-1935   DATE OF PROCEDURE:  04/08/2008  DATE OF DISCHARGE:  04/08/2008                            CARDIAC CATHETERIZATION   Scott Coleman is a 75 year old gentleman with a history of diabetes  mellitus - (recently discovered), hypertension and dyslipidemia.  He was  admitted to hospital a month ago and had positive cardiac enzymes.  He  originally showed up with dehydration and was found to have a glucose of  800.  He had a stress Cardiolite study which revealed an inferior wall  defect consistent with an inferior wall myocardial infarction with a  large area of lateral ischemia.  He is referred for heart  catheterization for further evaluation.   PROCEDURE:  Left heart catheterization with coronary angiography.   The right femoral artery was easily cannulated using a modified  Seldinger technique.   HEMODYNAMICS:  LV pressure is 129/3 with an aortic pressure of 128/52.   ANGIOGRAPHY:  Left main:  The left main is fairly normal.   The left anterior descending artery has a 40-50% stenosis at its origin.  There is a 60-70% irregular stenosis in the mid vessel.   There is mild disease distally.   The first diagonal artery is a rather large vessel.  There is a 70%  proximal stenosis.  There is a small branch that arises superiorly to  the diagonal vessel.  This branch is occluded and fills late.   The left circumflex artery is large.  There is a mid 50% stenosis and  then the vessel was occluded.  The distal circumflex artery  provides  flow to an obtuse marginal #2 distribution and fills via left-to-left  collaterals.   The first obtuse marginal artery is moderate in size.  There is a  sequential 80% stenoses in the first obtuse  marginal.   The right coronary artery is occluded proximally.  The distal right  coronary artery fills via right-to-right collaterals into a slight  degree.  I could not identify a true posterior descending artery.  I  think that the right coronary artery used to supply more of a posterior  descending artery and I do not think that it necessarily came off the  circumflex artery, although I cannot rule that out.   The left subclavian artery and left internal mammary artery is found to  be patent.  The left ventriculogram was performed in the 30 RAO  position.  It reveals mild reduction of left ventricular systolic  function.  There is inferior wall hypokinesis.  The ejection fraction is  40-50%.   COMPLICATIONS:  None.   CONCLUSION:  Severe three-vessel coronary artery disease.  The patient  has diabetes.  I think that he would benefit from coronary artery bypass  grafting.  We will consult the thoracic surgeons.      Vesta Mixer, M.D.  Electronically Signed     PJN/MEDQ  D:  04/08/2008  T:  04/08/2008  Job:  [id number]   cc:   Cardiovascular Thoracic Surgery  Molly Maduro L. Foy Guadalajara, M.D.

## 2010-06-30 NOTE — Discharge Summary (Signed)
Scott Coleman, Scott Coleman             ACCOUNT NO.:  [id number]   MEDICAL RECORD NO.:  [id number]          PATIENT TYPE:  INP   LOCATION:  1441                         FACILITY:  [facility]   PHYSICIAN:  Kela Millin, M.D.DATE OF BIRTH:  1935/09/17   DATE OF ADMISSION:  03/15/2008  DATE OF DISCHARGE:  03/18/2008                               DISCHARGE SUMMARY   DISCHARGE DIAGNOSES:  1. Non ketotic hyperosmolar hyperglycemia/uncontrolled diabetes      mellitus.  2. Acute renal failure - resolved, secondary to above.  3. Elevated troponin - per cardiology.  More likely secondary to      number 2, but the patient scheduled for outpatient stress test with      Dr. Elease Hashimoto.  4. Dysphagia/presumed oral Candida - improved.  5. Acute sinusitis.  6. Hypertension.  7. History of right renal cancer - status post right nephrectomy.  8. History of hyperlipidemia.  9. History of stroke and status post right carotid endarterectomy.  10.History of prostate cancer with seed implantation.  11.Status post cholecystectomy.   PROCEDURES AND STUDIES:  Barium swallow/tablet - no esophageal mass,  obstruction or definite stricture.  No definite explanation for the  patient's dysphagia identified.   CONSULTATIONS:  Cardiology, Dr. Elease Hashimoto.   BRIEF HISTORY:  The patient is a 75 year old white male with the above-  listed medical problems who presented with vomiting.  It was reported  that he had had pharyngitis and sinusitis for several weeks treated with  amoxicillin.  He reported that his appetite had been poor and he also  was complaining of pain involving the frontal sinus area as well as  maxillary sinus area.  He reported feeling very weak and had fallen  several times in the past month.  It was reported that he does have a  history of diabetes that has been diet controlled with hemoglobin A1c in  the 6 to 7 range.  Also it was reported that he had a ?Episode of coffee-  ground emesis.  He checked his  blood sugar and it read high on the  Glucometer in the office.  He also reported pain in his umbilical area  which resolved after the episode of emesis above.  He had a blood  pressure of 90 in the office.  He received IV fluids on his way to the  ER and he was admitted for further evaluation and management.   Please see the full admission history and physical dictated on March 15, 2008 by Dr. Lendell Caprice for the details of the admission physical exam  as well as the laboratory data.   HOSPITAL COURSE:  1. Non-ketotic hyperosmolar hyperglycemia - upon admission the patient      was started on IV insulin per glucose stabilizer protocol.  His      hemoglobin A1c was checked and it was elevated at 12.1.  His Accu-      Cheks were monitored on the IV insulin until they were better      controlled and as per glucose stabilizer protocol.  He was given      Lantus and then  the IV insulin discontinued.  He was also covered      with sliding scale insulin.  His sugars continued to be monitored      and his Lantus dose has been adjusted to 20 units.  He has also      been started on Amaryl q. a.m. and he is to keep a log of his blood      sugars and take them to his followup appointment for further      adjustment of his medication dosages as appropriate for optimal      blood glucose control.  He has been instructed on how to administer      insulin and he also is scheduled for outpatient diabetes teaching.      He is to follow up with his primary care physician outpatient as      already mentioned.  2. Acute renal failure - the patient's creatinine on admission was      noted to be elevated at 2.7.  With hydration this has improved to a      creatinine of 1.1 with a BUN of 23.  His ACE inhibitor was held      while he was in the hospital.  With improvement of his creatinine      he had the Prinzide which he was on previously has been changed to      lisinopril alone, which he is to continue  upon discharge.  3. Abnormal cardiac enzymes - as discussed above the patient's      systolic blood pressure was in the 90s in the clinic and so he had      cardiac enzymes done upon admission and his troponins were      elevated.  He did not have any chest pain but given that he is a      diabetic, cardiology was consulted and Dr. Elease Hashimoto saw the patient.      His impression was that the elevated troponins were likely      secondary to his impaired renal function, but given his risk      factors he is scheduled for an outpatient stress test with Dr.      Elease Hashimoto.  He is to follow up with him in 1 week.  4. Dysphagia/presumed oral Candida - while in the hospital the patient      reported that he had had difficulty swallowing solids for about 2      weeks and on his oral pharyngeal exam on admission it was noted to      be erythematous but no exudates.  The patient had been on empiric      antibiotics outpatient for pharyngitis but this was not improving.      He was empirically placed on Nystatin.  Also discussed the patient      with the [facility] and they recommended a barium      swallow with barium tablet and this was done and it was negative.      The patient's symptoms in the meantime improved significantly on      the nystatin.  He is tolerating solids well at this time and the      impression is that there was likely secondary to thrush.  He is to      continue the nystatin upon discharge.  5. Sinusitis - as mentioned above the patient had been on antibiotics      - amoxicillin for sinusitis outpatient without any  improvement in      his symptoms.  He was started on Avelox in the hospital and he is      to continue this to complete the antibiotic course upon discharge.  6. Hypertension - as discussed above his Prinzide was then changed to      lisinopril alone and he was also placed on a beta blocker given his      elevated troponins while in the hospital.  He is to  continue the      lisinopril and Lopressor upon discharge.  7. His other chronic medical problems remained stable during this      hospital stay and he was continued on his other outpatient      medications except as indicated above.   DISCHARGE MEDICATIONS:  1. Lisinopril 10 mg p.o. daily.  2. Hydrochlorothiazide discontinued as above.  3. Lopressor 25 mg one p.o. b.i.d.  4. Avelox 100 mg 1 p.o. daily for 6 more days.  5. Nystatin 6 mL q.i.d. for 10 days.  6. Amaryl 2 mg p.o. daily.  7. Lantus 20 units subcu nightly.  8. The patient to continue aspirin 325 mg p.o. daily.  9. Lovaza 2 grams p.o. b.i.d.  10.Omnaris as previously.  11.Zocor 40 mg p.o. nightly.  12.Tylenol p.r.n. as previously.   FOLLOWUP CARE:  1. Dr. Elease Hashimoto in 1 week, call for appointment.  2. Dr. Foy Guadalajara in 1 to 2 weeks, take log of blood sugars.   DISCHARGE CONDITION:  Improved/stable.      Kela Millin, M.D.  Electronically Signed     ACV/MEDQ  D:  03/19/2008  T:  03/19/2008  Job:  [id number]   cc:   Vesta Mixer, M.D.  Fax: [phone]   Doris Cheadle. Foy Guadalajara, M.D.  Fax: [phone]

## 2010-06-30 NOTE — H&P (Signed)
NAME:  Scott Coleman, Scott Coleman             ACCOUNT NO.:  [id number]   MEDICAL RECORD NO.:  [id number]          PATIENT TYPE:  INP   LOCATION:  1441                         FACILITY:  [facility]   PHYSICIAN:  Corinna L. Lendell Caprice, MDDATE OF BIRTH:  10/07/1935   DATE OF ADMISSION:  03/15/2008  DATE OF DISCHARGE:                              HISTORY & PHYSICAL   CHIEF COMPLAINT:  Vomiting.   HISTORY OF PRESENT ILLNESS:  Mr. Brazie is a 75 year old white male  patient of Dr. Foy Guadalajara, who was sent to the emergency room from the office  via EMS.  He has had pharyngitis and sinusitis for several weeks and has  been on amoxicillin.  Since then, he has had vomiting.  His appetite has  been poor.  He is complaining of pain involving the frontal sinus area  and maxillary sinus area.  He feels very weak.  He has fallen several  times in the past month.  He has a history of diet-controlled diabetes  with several recent hemoglobin A1c in 6 to 7 range.  Apparently, he had  an episode of coffee-ground emesis according to EMS notes.  He has had  no diarrhea.  He was noted to have a blood sugar which read high on  the Glucometer in the office.  The patient had some umbilical pain,  which resolved after emesis.  He had a blood pressure of about 90 in the  office.  He received IV fluids en route and has received a liter of  fluid here in the emergency room.  Currently, he reports that he is  feeling a bit better.  He is still having some headache.  He denies any  chest pain.  He denies shortness of breath.  He has had a stress test  several years ago with [facility], which was reportedly negative.   PAST MEDICAL HISTORY:  1. Hyperlipidemia.  2. Hypertension.  3. History of cerebrovascular accident and right carotid      endarterectomy.  4. History of right renal cell carcinoma, status post nephrectomy.  5. Status post cholecystectomy.  6. History of prostate cancer with seed implantation.   MEDICATIONS:  1. Amoxicillin 500 mg every 8 hours.  2. Aspirin 325 mg a day.  3. Lisinopril/hydrochlorothiazide 10/12.5 mg a day.  4. Lovaza 2 g twice a day.  5. Omnaris daily.  6. Simvastatin 40 mg a day.  7. Tylenol 650 mg as needed.   ALLERGIES:  Reported allergy to LIPITOR, reaction unknown.   SOCIAL HISTORY:  He does not smoke.  He denies heavy drinking or drugs.   FAMILY HISTORY:  His father died in his mid 1's.  His mother had a  stroke.   REVIEW OF SYSTEMS:  CONSTITUTIONAL:  No fevers, chills, or weight loss.  HEENT:  As above.  RESPIRATORY:  No cough or shortness of breath.  CARDIOVASCULAR:  As above.  GI:  As above.  GU:  No dysuria or  hematuria.  MUSCULOSKELETAL:  No myalgias.  SKIN:  No rash.  PSYCHIATRIC:  No depression.  NEUROLOGIC:  No history of seizures,  otherwise as above.  ENDOCRINE:  As above.  HEMATOLOGIC:  No history of  bleeding or clotting disorders.   PHYSICAL EXAMINATION:  VITAL SIGNS:  Temperature 97.5, blood pressure  133/77, pulse 98, respiratory rate 18, and oxygen saturation 96% on room  air.  GENERAL:  The patient is an ill-appearing white male.  HEENT:  Normocephalic and atraumatic.  Pupils are equal, round, and  reactive to light.  Sclerae nonicteric.  He has clear tympanic  membranes, boggy turbinates, dry mucous membranes, and erythematous  oropharynx without exudate.  NECK:  Supple.  No lymphadenopathy.  No JVD.  LUNGS:  Clear to auscultation bilaterally without wheezes, rhonchi, or  rales.  CARDIOVASCULAR:  Regular rate and rhythm without murmurs, gallops, or  rubs.  ABDOMEN:  Normal bowel sounds.  Soft, nontender, and nondistended.  GU:  Deferred.  RECTAL:  Deferred.  EXTREMITIES:  No clubbing, cyanosis, or edema.  SKIN:  No rash.  PSYCHIATRIC:  Normal affect.  NEUROLOGIC:  Alert and oriented.  Cranial nerves and sensory and motor  exam are grossly intact.   LABS:  White blood cell count is 12,000 with 82% neutrophils and 11%  lymphocytes,  hemoglobin 17.3, hematocrit 52, and platelet count normal.  Sodium 137, potassium 5.6, chloride 109, and bicarbonate 15.  Glucose  greater than 700, BUN 99, creatinine 2.7.  Total CPK 98, MB 6.2, and  troponin 0.2.  Myoglobin greater than 500.  Urinalysis shows greater  than 100 glucose, negative bilirubin, 40 ketones, small blood, negative  nitrite, negative leukocyte esterase, negative protein, and specific  gravity of 1.027.  EKG shows normal sinus rhythm with left ventricular  hypertrophy.  Anterolateral infarct, age indeterminate; inferior  infarct, age indeterminate.  As compared to previous EKG, he has more  prominent flipped T waves in leads I and aVL.  Acute abdominal series  shows nonspecific bowel gas pattern.  Lungs are clear.   ASSESSMENT/PLAN:  1. Hyperosmolar hyperglycemic state:  Continue vigorous hydration and      IV insulin.  The patient will get serial basic metabolic panels.  I      will also check a serum acetone to rule out a component of      ketoacidosis.  I will check hemoglobin A1c.  2. Acute renal failure secondary to above.  3. Elevated troponin.  This is rather puzzling as he has had one set      of point-of-care enzymes which showed a negative troponin, and a      set of cardiac enzymes done 10 minutes later, which showed slightly      elevated troponin.  I will cycle enzymes and check an EKG.  He has      had no chest pain.  Due to the vomiting of coffee-ground material,      I am hesitant to start ACS dose Lovenox and aspirin.  4. Sinusitis, failed amoxicillin.  I will give IV Avelox and Afrin.  5. Hyperlipidemia.  Hold medication for now.  Check fasting lipids.  6. Hypertension.  Hold medications.  7. Hyperkalemia: Should correct with IV fluids and insulin.  8. History of prostate cancer, status post seed implantation.  9. Questionable coffee-ground emesis.  I given b.i.d. Protonix and      check another CBC in the morning.  10.Right renal cell  carcinoma, status post nephrectomy.  11.Status post cholecystectomy.  12.History of stroke.  13.Right carotid endarterectomy.      Corinna L. Lendell Caprice, MD  Electronically Signed     CLS/MEDQ  D:  03/15/2008  T:  03/16/2008  Job:  [id number]   cc:   Molly Maduro L. Foy Guadalajara, M.D.  Fax: [phone]

## 2010-06-30 NOTE — H&P (Signed)
NAME:  MERIK, MIGNANO NO.:  [id number]   MEDICAL RECORD NO.:  [id number]           PATIENT TYPE:   LOCATION:                                 FACILITY:   PHYSICIAN:  Vesta Mixer, M.D. DATE OF BIRTH:  12-Nov-1935   DATE OF ADMISSION:  DATE OF DISCHARGE:                              HISTORY & PHYSICAL   Scott Coleman is a 75 year old gentleman with a history of diabetes  mellitus, hypertension, and dyslipidemia.  He is admitted for heart  catheterization after having an abnormal stress Cardiolite study.   I met Scott Coleman several weeks ago when I consulted on him in the  hospital.  He was admitted with a hyperosmolar coma.  He was not known  to be diabetic at that time.  He had some hypertension and borderline  shock.  He had some borderline troponin elevations.  At that time, he  denied any chest pain or shortness of breath.  He improved, and he is  now stable.  We set him up for an outpatient stress test.  The stress  test revealed the presence of an inferior wall myocardial infarction  with inferolateral ischemia.  He is now referred for heart  catheterization for further evaluation.   Scott Coleman still has not had any episodes of chest pain, shortness  breath, syncope, or presyncope.  He is feeling quite a bit better since  his sugars have been regulated.  He now checks his sugars and found that  they are in the 90-110 range.  He denies any syncope or presyncope.  He  denies any PND or orthopnea.  He denies any heat or cold intolerance,  weight gain, or weight loss.  He had some thrush associated with this  hyperglycemia, but this has resolved.   CURRENT MEDICATIONS:  1. Glimepiride 2 mg a day.  2. Lisinopril 10 mg a day.  3. Metoprolol 25 mg a day.  4. Simvastatin 40 mg a day.  5. Lovaza 1 g - 2 tablets twice a day.  6. Nystatin 6 mL swish and swallow 4 times a day.  7. Ocuvite once a day.  8. Multivitamin once a day.  9. Lantus insulin 20 units  nightly.   ALLERGIES:  None.   PAST MEDICAL HISTORY:  1. Hyperlipidemia.  2. Hypertension.  3. History of a stroke - status post right CEA.  4. History of renal cell carcinoma - status post right nephrectomy.   SOCIAL HISTORY:  The patient is a nonsmoker.   FAMILY HISTORY:  His father has a history of CVA.   REVIEW OF SYSTEMS:  He has had lots of weakness.  He has regained some  strength since his hospitalization.  He has not had any episodes of  chest pain as noted in the HPI.  All other systems were reviewed and are  negative.   PHYSICAL EXAMINATION:  GENERAL:  He is an elderly gentleman, in no acute  distress.  He is alert and oriented x3, and his mood and affect are  normal.  VITAL SIGNS:  His weight is 170, blood pressure is 120/64  with heart  rate of 72.  NECK:  A 2+ carotids.  He has no bruits, no JVD, no thyromegaly.  His  neck is supple.  HEENT:  He is normocephalic and atraumatic.  LUNGS:  Clear.  HEART:  Regular rate.  S1 and S2.  He has a soft systolic murmur.  ABDOMEN:  Good bowel sounds, and is nontender.  EXTREMITIES:  He has no clubbing, cyanosis, or edema.  Pulses are 2+.  SKIN:  Warm and dry.  NEUROLOGIC:  His gait is normal.   His stress Cardiolite study reveals an inferolateral ischemia with an  inferior wall infarct.  He has a moderately reduced left ventricular  systolic function with ejection fraction of 43%.   Scott Coleman presents with an abnormal stress Cardiolite study.  It  appears that he does have an inferolateral lesion, and I suspect that he  has a lesion in the circumflex artery.  We will schedule him for a heart  catheterization.  We have discussed the risks, benefits, and options of  heart catheterization.  He understands and agrees to proceed.      Vesta Mixer, M.D.  Electronically Signed     PJN/MEDQ  D:  04/05/2008  T:  04/06/2008  Job:  [id number]   cc:   Molly Maduro L. Foy Guadalajara, M.D.

## 2010-06-30 NOTE — Assessment & Plan Note (Signed)
OFFICE VISIT   BRYSIN, TOWERY  DOB:  1935-11-08                                        May 09, 2008  CHART #:  [id number]   The patient returns today for followup, status post coronary artery  bypass graft surgery x5 on April 17, 2008.  He had an uncomplicated  postoperative course and has been doing well since discharge.  His only  complaint is of some discomfort early in the morning around his chest  tube sites in the epigastrium.  He has been walking daily without chest  pain or shortness of breath.   PHYSICAL EXAMINATION:  VITAL SIGNS:  Blood pressure 146/75, his pulse is  78 and regular, his respiratory rate is 18, unlabored.  Oxygen  saturation on room air is 98%.  GENERAL:  He looks well.  CARDIAC:  Regular rate and rhythm with normal heart sounds.  LUNGS:  Clear.  CHEST:  The chest incision is healing well and the sternum is stable.  Chest tube sites are well healed.  EXTREMITIES:  The right leg vein harvest incision is healing well.  There is some induration along the vein harvest tunnel and no sign of  infection.  There is a mild edema in the right lower leg.   Followup chest x-ray today shows clear lung fields and no pleural  effusions.   His medications are glimepiride 2 mg daily, Zocor 40 mg nightly,  Lopressor 50 mg b.i.d., Lovaza 2 g b.i.d., lisinopril 10 mg daily,  Lantus insulin, aspirin 81 mg daily, multivitamin daily, and iron 150 mg  daily.  He has been taking occasional Ultram p.r.n. for pain.   IMPRESSION:  Overall, the patient is recovering well following his  surgery.  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  He will continue  ambulating.  I  recommended a cardiac rehab.  He will continue to follow up with Dr.  Elease Hashimoto and contact me if he develops any problems with his incisions.   Evelene Croon, M.D.  Electronically Signed   BB/MEDQ  D:  05/09/2008  T:  05/10/2008  Job:  [id number]   cc:   Vesta Mixer, M.D.

## 2010-06-30 NOTE — Op Note (Signed)
NAMEEWIN, REHBERG NO.:  [id number]   MEDICAL RECORD NO.:  [id number]          PATIENT TYPE:  INP   LOCATION:  2301                         FACILITY:  [facility]   PHYSICIAN:  Evelene Croon, M.D.     DATE OF BIRTH:  01-13-1936   DATE OF PROCEDURE:  DATE OF DISCHARGE:                               OPERATIVE REPORT   PREOPERATIVE DIAGNOSIS:  Severe three-vessel coronary artery disease.   POSTOPERATIVE DIAGNOSIS:  Severe three-vessel coronary artery disease.   OPERATIVE PROCEDURE:  Median sternotomy, extracorporeal circulation,  coronary artery bypass graft surgery x5 using a left internal mammary  artery graft to the left anterior descending coronary artery, with a  saphenous vein graft to the diagonal branch of the left anterior  descending, a sequential saphenous vein graft to the second and fourth  obtuse marginal branches of the left circumflex coronary artery, and a  saphenous vein graft to the right coronary artery.  Endoscopic vein  harvesting from the right leg.   ATTENDING SURGEON:  Evelene Croon, M.D.   ASSISTANT:  Doree Fudge, PA-C.   ANESTHESIA:  General endotracheal.   CLINICAL HISTORY:  This patient is a 75 year old gentleman with no prior  history of cardiac disease who was admitted at the end of January with  hyperosmolar coma and a new diagnosis of diabetes.  He had borderline  elevated troponin levels at that time but had not had any chest pain or  shortness of breath.  He recovered and was started on treatment for  diabetes.  He had an outpatient strep test, which showed inferior wall  myocardial infarction with inferolateral ischemia.  He subsequently  underwent cardiac catheterization by Dr. Elease Hashimoto on April 08, 2008.  This shows severe three-vessel disease.  The LAD had 40%-50% stenosis at  its origin and then a 60%-70% long irregular stenosis in the mid vessel.  The first diagonal branch was a large vessel, had 70% ostial and  proximal stenosis.  The left circumflex was a large dominant vessel that  had 50% mid vessel stenosis and then complete occlusion.  There was a  tiny first marginal branch.  The second marginal was a moderate-sized  vessel, that has sequential 80% stenosis in the first portion.  The  third marginal was a very small vessel.  The distal left circumflex  provided collaterals to a fourth marginal or posterolateral branch.  This was a large vessel.  The right coronary artery was occluded  proximally with filling of the distal vessel by right-to-right  collaterals and left-to-right collaterals.  The left internal mammary  artery was widely patent.  The left ventricular ejection fraction was  40%-50% with inferior wall hypokinesis.   After view of the angiogram and examination of the patient, it was felt  that coronary artery bypass graft surgery was the best treatment to  prevent further ischemia and infarction.  I discussed the operative  procedure with the patient including alternatives, benefits, and risks  including, but not limited to bleeding, blood transfusion, infection,  stroke, myocardial infarction, graft failure, and death.  He understood  and agreed to proceed.  OPERATIVE PROCEDURE:  The patient was taken to the operating room and  placed on the table in the supine position.  After induction of general  endotracheal anesthesia, a Foley catheter was placed in the bladder  using sterile technique.  Then, the chest, abdomen, and both lower  extremities were prepped and draped in usual sterile manner.  Preoperative intravenous antibiotics were given.  The chest was opened  through a median sternotomy incision and the pericardium opened in the  midline.  The examination of the heart showed good ventricular  contractility.  The ascending aorta was of normal size and had no  palpable plaque in it.   Then, the left internal mammary artery was harvested from the chest wall  as a  pedicle graft.  This was a medium caliber vessel with excellent  blood flow through it.  At the same time, a 7 mm greater saphenous vein  was harvested from the right leg using endoscopic vein harvesting  technique.  This vein was of medium size and good quality.   The patient was then heparinized when an adequate activated clotting  time was achieved, the distal ascending aorta was cannulated using a 20-  [place] aortic cannula for arterial inflow.  Venous outflow was achieved  using a two-stage venous cannula through the right atrial appendage.  An  antegrade cardioplegia and vent cannula was inserted in the aortic root.   The patient was then placed in cardiopulmonary bypass and distal  coronary was identified.  The LAD was a large graftable vessel with no  distal disease in it.  The diagonal branch was a moderate-sized vessel  with no significant distal disease in it.  The left circumflex give off  tiny first and third marginal branches that were not graftable.  The  second marginal and fourth marginal branches were moderate-sized  graftable vessels.  The right coronary artery was a nondominant vessel,  they give off a single posterior descending branch just below the acute  margin.  There was no distal disease in the right coronary artery.   Then, the aorta was cross clamped and 600 mL of cold blood antegrade  cardioplegia was administered in the aortic root with quick arrest of  the heart.  Systemic hypothermia to 28 degrees centigrade and topical  hypothermia with iced saline was used.  A temperature probe was placed  in the septum and insulating pad in the pericardium.   The first distal anastomosis was performed to the second marginal  branch.  The internal diameter of this vessel was about 1.6 mm.  The  conduit used was a 7-mm saphenous vein and the anastomosis was performed  in a sequential side-to-side manner using continuous 7-0 Prolene suture.  Flow noted to the graft was  excellent.   The second distal anastomosis was performed to the fourth marginal or  posterolateral branch.  The internal diameter of this vessel was about  1.75 mm.  The conduit used was a same 7-mm saphenous vein, and the  anastomosis performed in a sequential end-to-side manner using  continuous 7-0 Prolene suture.  Flow noted to the graft was excellent.  Then another dose of cardioplegia was given on vein graft in order to  repair.   A third distal anastomosis was performed to the distal right coronary  artery.  The internal diameter of this vessel was about 1.75 mm.  The  conduit used was a second 7-mm saphenous vein, and the anastomosis was  performed in an end-to-side  manner using a continuous 7-0 Prolene  suture.  Flow noted to the graft was excellent.   The fourth distal anastomosis was performed to the diagonal branch.  The  internal diameter was 1.6 mm.  The conduit used was a third segment of  greater saphenous vein, and the anastomosis performed in an end-to-side  manner using a continuous 7-0 Prolene suture.  Flow noted to the graft  was excellent.  Then, a dose of cardioplegia was given down the vein  graft and the aortic root repaired.   The fifth distal anastomosis was performed to the mid portion of the  left anterior descending coronary artery.  The internal diameter of this  vessel was about 2 mm.  The conduit used was a left internal mammary  graft and is brought through an opening in the left pericardium anterior  to the phrenic nerve.  It was anastomosed through the mid LAD in an end-  to-side manner using a continuous 8-0 Prolene suture.  The pedicle was  sutured.  The epicardium with 6-0 Prolene.  The patient rewarmed with 37  degrees centigrade.  Another dose of cardioplegia was given.  With a  cross clamp in place, the three proximal vein graft anastomosis were  performed to the aortic root in end-to-side manner using continuous 6-0  Prolene suture.  The  clamps then removed from the mammary and pedicle.  There was rapid warming of the ventricular septum and returned  spontaneous ventricular fibrillation.  The cross clamps removed at the  time of 84 minutes and the patient spontaneously converted to sinus  rhythm.  A proximal and distal anastomosis appeared hemostatic and lie  of the graft was satisfactory.  A graft marker was placed around the  proximal anastomosis.  Two temporary right ventricular and right atrial  pacing wires were placed and brought through the skin.   When the patient rewarmed at 37-degree centigrade, he was weaned from  cardiopulmonary bypass on no inotropic agents.  Total bypass time was 99  minutes.  Cardiac function appeared excellent with a cardiac output of 5  L/min.  Protamine was given, and the venous and aortic clamps removed  without difficulty.  Hemostasis was achieved.  Three chest tubes were  placed with the tube in the posterior pericardium, one in the left  pleural space, and one in the anterior mediastinum.  The pericardium was  reapproximated over the heart.  The sternum was closed with #6 stainless  steel wires.  The fascia was closed with continuous #1 Vicryl suture.  Subcutaneous tissue was closed with continuous 2-0 Vicryl, and the skin  with 3-0 Vicryl subcuticular closure.  The large extremity vein harvest  site was closed in layers in a similar manner.  The sponge, needle, and  instruments counts were correct according to the scrub nurse.  Dry  sterile dressing were applied over the incisions, and around the chest  tube with Pleur-Evac suction.  The patient remained hemodynamically  stable and was transported to the SICU in guarded but stable condition.      Evelene Croon, M.D.  Electronically Signed     BB/MEDQ  D:  04/17/2008  T:  04/18/2008  Job:  [id number]   cc:   Vesta Mixer, M.D.

## 2010-06-30 NOTE — Consult Note (Signed)
NAME:  Scott Coleman NO.:  [id number]   MEDICAL RECORD NO.:  [id number]          PATIENT TYPE:  INP   LOCATION:  1441                         FACILITY:  [facility]   PHYSICIAN:  Vesta Mixer, M.D. DATE OF BIRTH:  1935-02-25   DATE OF CONSULTATION:  03/16/2008  DATE OF DISCHARGE:                                 CONSULTATION   Scott Coleman is a 74 year old gentleman who was admitted to the  hospital with a scratchy raw throat, poor p.o. intake and hypertension.  He was incidentally found to have some elevated cardiac enzymes.  We are  asked to see him for further evaluation.   Scott Coleman has a history of vascular disease.  He is status post  carotid endarterectomy.  He also has a history of nephrectomy due to  renal cell carcinoma.  He was admitted to the hospital on January 29 by  Dr. Lendell Caprice with episodes of extremely elevated blood sugars.  He had  some hypertension.  He felt a little bit better after receiving some IV  fluids.  He was ultimately found to have a glucose level of greater than  800.  He was also noted to have some elevated cardiac enzymes with a  troponin of 0.27.  His CPK levels were fairly normal.   He denies any chest pain or shortness of breath, syncope or presyncope.  He denies any PND or orthopnea.  He has had a very sore throat and  states that it is the sorest that it has ever been.   CURRENT MEDICATIONS:  1. Include amoxicillin for the past couple days.  2. Aspirin 325 mg daily.  3. Lisinopril hydrochlorothiazide 10 mg/12.5 mg daily.  4. Lovaza 2 grams daily.  5. Nasal spray once daily.  6. Simvastatin 40 mg daily.  7. Tylenol as needed.   ALLERGIES:  None.   PAST MEDICAL HISTORY:  1. History of stroke - status post right CEA  2. Hyperlipidemia.  3. Hypertension.  4. Right renal cell carcinoma with status post right nephrectomy.   SOCIAL HISTORY:  The patient is a nonsmoker.   FAMILY HISTORY:  His mother has a history  of CVA.   REVIEW OF SYSTEMS:  He has had lots of weakness.  He has had a very poor  p.o. intake due to his raw throat.  His throat is quite dry.  He denies  any heat or cold intolerance, weight gain or weight loss (until  recently).  Denies any change in his bowel habits.  All other systems  were reviewed and are negative.   EXAM:  He is an elderly gentleman in no acute distress.  His temperature  is 97.3, blood pressure 153/58, heart rate 80.  HEENT:  Exam reveals 2+ carotids.  His throat was red but I did not see  any exudates.  He is normocephalic, atraumatic.  NECK:  Is supple.  LUNGS:  Are clear.  His back is nontender.  HEART:  Regular rate, S1, S2.  His PMI is nondisplaced.  ABDOMINAL EXAM:  Reveals good bowel sounds and is nontender.  EXTREMITIES:  There is  no clubbing, cyanosis or edema.  NEURO EXAM:  Is nonfocal.  SKIN:  Is warm and dry.  Gait was not assessed.   LABORATORY DATA:  His initial glucose level was 800.  His troponin is  0.27.  His white blood cell count 14.1.  His creatinine is 2.02.  Sodium  is 155 (this is much higher than previous levels).  His EKG reveals normal sinus rhythm.  He has left ventricular  hypertrophy.  He has nonspecific ST-T wave changes.  He has poor R-wave  progression on one EKG, but it is not seen in the other EKGs.   IMPRESSION AND PLAN:  1. Abnormal cardiac enzymes.  He does not have any real symptoms      consistent with an acute coronary syndrome.  Most of his symptoms      are secondary to poor p.o. intake and hypertension.  I do not have      a clear explanation but it appears that he may have new onset      diabetes mellitus and may in fact have thrush.  This has led to      poor p.o. intake and his subsequent hypertension.  He is probably      volume depleted and has some degree of renal insufficiency.  This      certainly could explain his troponin levels.  He does have a      history of vascular disease including a stroke and  a carotid      endarterectomy.  He certainly needs an adenosine Cardiolite study      once he is stabilized.  At this point I do not think that we need      to do one in the hospital and he certainly does not need heart      catheterization at this time.  2. Hypernatremia.  This certainly could be laboratory error.  These      levels are certainly much higher than they were previously.  3. Possible thrush.  We will write him for some Nystatin swish and      swallow and see if that helps.  All of the other medical problems      are stable.      Vesta Mixer, M.D.  Electronically Signed     PJN/MEDQ  D:  03/17/2008  T:  03/17/2008  Job:  [id number]   cc:   Molly Maduro L. Foy [place], M.D.  Fax: [phone]

## 2010-06-30 NOTE — Consult Note (Signed)
NEW PATIENT CONSULTATION   Scott Coleman, Scott Coleman  DOB:  10-Jun-1935                                        April 09, 2008  CHART #:  [id number]   REASON FOR CONSULTATION:  Severe 3-vessel coronary artery disease.   CLINICAL HISTORY:  I was asked by Dr. Elease Hashimoto to evaluate the patient for  consideration of coronary artery bypass graft surgery.  He is a 75-year-  old gentleman with no prior history of cardiac disease who was admitted  at the end of January with hyperosmolar, and a new diagnosis of  diabetes.  He had borderline elevated troponin levels at that time, but  not had any chest pain or shortness of breath.  He improved and was  scheduled for an outpatient stress test, which showed inferior wall  myocardial infarction with inferolateral ischemia.  He subsequently  underwent cardiac catheterization by Dr. Elease Hashimoto on 04/08/2008, which  showed severe 3-vessel disease.  The LAD had 40-50% stenosis at its  origin and then a 60-70% long irregular stenosis in the mid vessel.  The  first diagonal branch was a large vessel had 70% ostial and proximal  stenosis.  The left circumflex was a large vessel that had 50% midvessel  stenosis and then complete occlusion.  The first obtuse marginal was a  moderate-sized vessel that had a sequential 80% stenosis in the first  portion.  The distal left circumflex provided flow to a second marginal  or posterolateral branch by left-to-left collaterals.  This was a large  vessel.  The right coronary artery was occluded proximally with a distal  vessel filling by right-to-right collaterals.  The left internal mammary  artery was patent.  Left ventricular ejection fraction was 40-50% with  inferior wall hypokinesis.   REVIEW OF SYSTEMS:  General:  He denies any fever or chills.  He has had  no recent weight changes.  He does report some recent fatigue.  Eyes:  He has had some blurred vision over the past few months.  ENT:  Negative.   Endocrine:  He has newly diagnosed diabetes.  He has been  checking his blood sugars at home now and reports that they are under  tight control.  He denies hypothyroidism.  Cardiovascular:  He denies  any chest pain or pressure.  He denies any shortness of breath.  He has  had no PND or orthopnea.  He denies peripheral edema.  Respiratory:  He  denies cough and sputum production.  GI:  Has had no nausea or vomiting.  Denies melena and bright red blood per rectum.  GU:  He denies dysuria  and hematuria.  He is status post radioactive seed implant for prostate  cancer and status post right nephrectomy about 5 years ago for renal  cell carcinoma.  Vascular:  He denies claudication and phlebitis.  Neurological:  He is status post right carotid endarterectomy about 10  years ago after a small stroke.  At the present time, he denies any  focal weakness or numbness.  He denies dizziness and syncope.  Psychiatric:  Negative.  Hematological:  He denies any history of  bleeding disorders or easy bleeding.   ALLERGIES:  None.   MEDICATIONS:  Glimepiride 2 mg daily, Zocor 40 mg nightly, Lopressor 25  mg b.i.d., Lovaza 2 g p.o. b.i.d., lisinopril 10 mg daily,  Lantus  insulin 20 units before bedtime, aspirin 81 mg daily, multivitamin  daily, Ocuvite daily, and Prosterin dietary supplement.   PAST MEDICAL HISTORY:  Significant for hypertension and hyperlipidemia.  He has newly diagnosed diabetes.  He is status post right brain stroke  about 10 years ago and status post right carotid endarterectomy by Dr.  Edilia Bo.  He has had no residual from that stroke.  He is status post  right nephrectomy and radioactive prostate seed implant about 5 years  ago by Dr. Brunilda Payor for renal cell carcinoma and prostate cancer.  He is  status post umbilical hernia repair in the past.  He is status post left  total knee replacement about 10 years ago.   SOCIAL HISTORY:  He is married and lives with his wife.  He is a   nonsmoker and denies alcohol abuse.   FAMILY HISTORY:  His father had a history of stroke.  There is no  history of coronary artery disease.   PHYSICAL EXAMINATION:  VITAL SIGNS:  Blood pressure is 140/64, his pulse  is 51 and regular, his respiratory rate is 18 and unlabored.  Oxygen  saturation on room air is 98%.  GENERAL:  He is a well-developed elderly white male, in no distress.  HEENT:  Be normocephalic and atraumatic.  Pupils are equal and reactive  to light and accommodation.  Extraocular muscles are intact.  His throat  is clear.  NECK:  Normal carotid pulses bilaterally.  There are no bruits.  There  is a right carotid endarterectomy scar.  There is no adenopathy or  thyromegaly.  CARDIAC:  Regular rate and rhythm with normal S1 and S2.  There is no  murmur, rub, or gallop.  LUNGS:  Clear.  ABDOMEN:  Active bowel sounds.  His abdomen is soft and nontender.  There are no palpable masses or organomegaly.  EXTREMITIES:  No peripheral edema.  Pedal pulses are palpable  bilaterally.\  SKIN:  Warm and dry.  NEUROLOGIC:  Be alert and oriented x3.  Motor and sensory exams are  grossly normal.   Transthoracic echocardiogram on March 18, 2008, showed overall left  ventricular systolic function which was mildly decreased with ejection  fraction of 45-50% with severe hypokinesis of the inferior wall.  There  was trivial aortic insufficiency and mild mitral regurgitation.   IMPRESSION:  The patient has severe 3-vessel coronary artery disease  with mild left ventricular dysfunction and no significant symptoms.  He  has never had any chest pain or shortness of breath.  I agree that  coronary artery bypass graft surgery is the best treatment for his  further ischemia and infarction.  I discussed the operative procedure  with the patient and his wife including alternatives, benefits, and  risks including but not limited to bleeding, blood transfusion,  infection, stroke, myocardial  infarction, graft failure, and death.  He  understands and agrees to proceed.  We will obtain preoperative carotid  Doppler examination given his history of prior stroke and right carotid  endarterectomy about 10 years ago.   Evelene Croon, M.D.  Electronically Signed   BB/MEDQ  D:  04/09/2008  T:  04/09/2008  Job:  [id number]   cc:   Vesta Mixer, M.D.  Robert L. Foy [place], M.D.

## 2010-07-03 NOTE — Discharge Summary (Signed)
NAMEBRONDON, Scott Coleman             ACCOUNT NO.:  [id number]   MEDICAL RECORD NO.:  [id number]          PATIENT TYPE:  INP   LOCATION:  2037                         FACILITY:  [facility]   PHYSICIAN:  Evelene Croon, M.D.     DATE OF BIRTH:  29-Apr-1935   DATE OF ADMISSION:  04/17/2008  DATE OF DISCHARGE:  04/22/2008                               DISCHARGE SUMMARY   ADMITTING DIAGNOSES:  1. History of an myocardial infarction.  2. Multivessel coronary artery disease (with an ejection fraction of      40-50%).  3. History of hyperlipidemia.  4. History of hypertension.  5. Newly-diagnosed diabetes mellitus.  6. History of cerebrovascular accident.  7. History of renal cell carcinoma.  8. History of prostate cancer.   DISCHARGE DIAGNOSES:  1. History of an myocardial infarction.  2. Multivessel coronary artery disease (with an ejection fraction of      40-50%).  3. History of hyperlipidemia.  4. History of hypertension.  5. Newly-diagnosed diabetes mellitus.  6. History of cerebrovascular accident.  7. History of renal cell carcinoma.  8. History of prostate cancer.  9. In addition to postoperative acute blood loss anemia.  10.Mild erythema of right lower extremity wounds.   PROCEDURE:  Coronary artery bypass graft x5 (left internal mammary  artery to left anterior descending, saphenous vein graft to diagonal  branch of the left anterior descending, saphenous vein graft  sequentially to OM-2 and 4 of the circumflex coronary artery and  saphenous vein graft  to the right coronary artery with endoscopic vein  harvest from the right leg by Dr. Laneta Simmers on April 17, 2008.   HISTORY OF PRESENT ILLNESS:  This is a 75 year old Caucasian male whom  Dr. Laneta Simmers first evaluated in the office on April 09, 2008.  The  patient had a past medical history of hyperlipidemia, hypertension, and  CVA who was admitted to the end of January for hyperosmolar, with a  diagnosis of diabetes mellitus.  At  that time, he had borderline  elevated troponin levels, but did not have any chest pain or shortness  of breath.  He was treated appropriately for his diabetes and was then  discharged as an outpatient.  He had a stress test, which showed  inferior wall myocardial infarction and inferolateral ischemia.  He then  underwent a cardiac catheterization by Dr. Elease Hashimoto on April 08, 2008,  which showed severe 3-vessel coronary artery disease (LAD 40-50%  stenosis at its origin with a 60-70% long residual stenosis at the mid  vessel).  First diagonal branch was a large vessel and had a 70% ostial  and proximal stenosis.  Left circumflex also is a large vessel and had  approximately 50% mid vessel stenosis and incomplete occlusion.  The  first OM was a moderate-sized vessel, had a sequential 80% stenosis.  The RCA was occluded proximally as well.  Because of the patient's  history of CVA, duplex carotid ultrasound was obtained which showed no  significant right internal carotid artery stenosis and no significant  left internal carotid artery stenosis.  The patient was then admitted to  [facility] on April 17, 2008, to undergo the aforementioned coronary  artery bypass grafting surgery with Dr. Laneta Simmers.   BRIEF HOSPITAL COURSE STAY:  The patient was extubated late the evening  of surgery.  On postoperative day #1, he did have a low-grade fever.  He  was A-paced.  H and H was 8.3 and 23.8.  He was weaned off his drips as  tolerated.  The A1 and Swan were then removed as well as the chest  tubes.  Followup chest x-ray revealed no pneumothorax and bilateral  atelectasis.  CBGs were well controlled with Lantus and a sliding scale.  The patient's H and H later that evening decreased to 7.5 and 21.2.  He  was given 2 units of packed red blood cells.  Followup H and H was then  9.7 and 27.9 respectively.  Iron was also started.  The patient was then  transferred from the Intensive Care Unit and PCTU for  further  convalescence.  He remained afebrile and hemodynamically stable.  He did  not require any further blood transfusions.  He continued to progress  with cardiac rehabilitation.  He was volume overloaded and diuresed  accordingly.  He was also found to have thrombocytopenia.  His platelets  did increase and his thrombocytopenia was resolved prior to discharge.  In addition, because he had an episode of hypoglycemia, his nighttime  dose of insulin was decreased accordingly.  He continued to improve such  that by postop day #5, he had already been tolerating a diet.  He had  had a bowel movement.  He was afebrile.  His systolic blood pressure was  done to remain more elevated in the 140s, it was 165 the morning of  discharge.  His Lopressor was increased accordingly.  His O2 sat was 94-  97% on room air.  His preop weight was 75 kg.  Today's weight was 76.1  kg.  CBGs were well controlled on 117, 81, and 101.   PHYSICAL EXAMINATION:  CARDIOVASCULAR  Regular rate and rhythm.  PULMONARY:  Clear to auscultation bilaterally.  No rales, wheezes, or  rhonchi.  ABDOMEN:  Soft, nontender.  Bowel sounds present.  EXTREMITIES:  Mild lower extremity edema, right greater than left.  Sternal and right lower extremity wounds were clean and dry.  There was  mild erythema noted at the right lower extremity wounds likely secondary  to suture reaction.  There were no tumors or tenderness, however.   The patient was discharged on this date April 22, 2008.   LABORATORY DATA:  Latest laboratory studies on April 21, 2008.  Last BMET  done showed the potassium to be 4.2, BUN and creatinine were 16 and 1.18  respectively.  Last CBC also done on this date, H and H 9.4 and 27.3,  white count of 5900, platelet count 160,000.  Last chest x-ray was done  on April 20, 2008, which showed improving aeration and persistent small  bilateral pleural effusions.   DISCHARGE INSTRUCTIONS:  The patient was instructed not  to drive or lift  more than 10 pounds.  He is to continue with his breathing exercise  daily.  He is to walk every day and increase his routine endurance as  tolerates.  He is to remain on a low-fat, low-salt, carbohydrate-  modified medium caloric diet.  He was instructed he may shower and to  cleanse his wounds with mild soap and water.  He is to call the office  if he  had any increasing redness, tenderness, discharge, fever, or  chills.   FOLLOWUP APPOINTMENTS:  1. The patient needs to contact Dr. Harvie Bridge office for followup      appointment in 2 weeks.  2. [facility] will contact the patient on      Monday for followup appointment to see Dr. Laneta Simmers in 3 weeks.      Prior to this appointment, a chest x-ray will be obtained.   DISCHARGE MEDICATIONS:  1. Glimepiride 2 mg p.o. daily.  2. Simvastatin 40 mg p.r.n. nightly.  3. Lovaza p.o. 2 times daily, dosage is taken preoperatively.  4. Lisinopril 10 mg p.o. daily.  5. Enteric-coated aspirin 81 mg p.o. daily.  6. Multivitamin p.o. daily.  7. Ocuvite 1 tablet p.o. daily.  8. Nu-Iron 150 mg p.o. daily.  9. Lasix 40 mg p.o. daily.  10.KCl 20 mg p.o. daily both for 5 days.  11.Lantus insulin 10 units subcu nightly.  The patient is instructed      to gradually increase back to his 20 units as his blood sugars      would allow.  12.Lopressor 50 mg p.o. 2 times daily.  13.Keflex 500 mg p.o. 3 times daily x5 days.  14.Ultram 50 mg 1-2 tablets p.o. q. 4-6 hours as needed for pain.      Doree Fudge, [place]      Evelene Croon, M.D.  Electronically Signed    DZ/MEDQ  D:  04/24/2008  T:  04/25/2008  Job:  [id number]   cc:   Vesta Mixer, M.D.  Robert L. Foy Guadalajara, M.D.

## 2010-07-03 NOTE — Op Note (Signed)
NAME:  Scott Coleman, Scott Coleman             ACCOUNT NO.:  [id number]   MEDICAL RECORD NO.:  [id number]          PATIENT TYPE:  AMB   LOCATION:  NESC                         FACILITY:  [facility]   PHYSICIAN:  Lindaann Slough, M.D.  DATE OF BIRTH:  December 14, 1935   DATE OF PROCEDURE:  03/30/2004  DATE OF DISCHARGE:                                 OPERATIVE REPORT   PREOPERATIVE DIAGNOSIS:  Adenocarcinoma of prostate.   POST-PROCEDURE DIAGNOSIS:  Adenocarcinoma of prostate.   PROCEDURE DONE:  I-125 seeds implantation and cystoscopy.   SURGEONS:  1.  Lindaann Slough, M.D.  2.  Maryln Gottron, M.D.   ANESTHESIA:  General.   INDICATIONS:  The patient is a 75 year old male who had a positive prostate  biopsy done on October 31, 2004.  PSA was 13.13, and Gleason score 6.  Treatment options were discussed with the patient, and he has chosen to have  seeds implantation  At the time of biopsy, the volume of the prostate was  81.74 cc.  He received 1 shot of Lupron, and the volume of the prostate went  down to 51.9 cc.  He is scheduled today for seeds implantation.   Under general anesthesia, the patient was prepped and draped and placed in  the dorsal lithotomy position.  A Foley catheter was inserted in the  bladder, and a rectal tube was also inserted into the rectum.  Then, the  transducer was passed in the rectum.  An ultrasound of the prostate was  done.  When the images were identical to the preplanning ultrasound, the  transducer was fixed, and the grid was attached to the transducer.  Then,  under ultrasound and fluoroscopic guidance, two stabilizing needles were  placed in the prostate.  Then, a total of 79 seeds were implanted in the  prostate under ultrasound and fluoroscopic guidance.   The Foley catheter was then removed, and a flexible cystoscope was inserted  in the bladder.  The bladder mucosa is normal.  There is no stone or tumor  in the bladder.  There is no evidence of seeds in  the bladder either.  The  cystoscope was then retroflexed, and there was no evidence of seeds at the  bladder neck.  The cystoscope was then removed.  A #16 Foley catheter was  then inserted in the bladder.   The patient tolerated the procedure well and left the O.R. in satisfactory  condition to the postanesthesia care unit.      MN/MEDQ  D:  03/30/2004  T:  03/30/2004  Job:  [id number]   cc:   Maryln Gottron, M.D.  [address] [facility]  [place]  [place] [address]  Fax: [phone]   Ollen Gross. Vernell Morgans, M.D.  [address]  [place]  [place] [address]

## 2010-07-03 NOTE — Consult Note (Signed)
NAME:  Scott Coleman, FOUNTAIN NO.:  [id number]   MEDICAL RECORD NO.:  [id number]                   PATIENT TYPE:  INP   LOCATION:  5715                                 FACILITY:  [facility]   PHYSICIAN:  Lindaann Slough, M.D.               DATE OF BIRTH:  Dec 17, 1935   DATE OF CONSULTATION:  05/26/2003  DATE OF DISCHARGE:                                   CONSULTATION   REASON FOR CONSULTATION:  Right renal mass.   HISTORY OF PRESENT ILLNESS:  The patient is a 75 year old male who was  admitted on May 24, 2003, with severe abdominal pain on May 24, 2003.  The  pain seemed to radiate to the left side of his abdomen.  He also had nausea  and vomiting.  A CT scan of the abdomen and pelvis and MRI of the abdomen  showed a solid mass in the upper pole of the right kidney.  The patient  denies any history of hematuria or flank pain.   PAST MEDICAL HISTORY:  1. He has a history of hypertension.  2. He had a CVA about five years ago with residual left-sided weakness.   PAST SURGICAL HISTORY:  1. Left total knee replacement.  2. Carotid endarterectomy about three years ago.   SOCIAL HISTORY:  He is married.  He has one child.  He does not smoke nor  drink.   ALLERGIES:  He has no known drug allergies.   MEDICATIONS:  1. Aspirin 325 mg daily.  2. A cholesterol-lowering medication, but does remember the name of the     medication.   FAMILY HISTORY:  He does not know much about his father.  His mother is  alive and had a CVA.   PHYSICAL EXAMINATION:  VITAL SIGNS:  His blood pressure is 131/69, pulse 65,  respirations 18, and temperature 98 degrees.  ABDOMEN:  Soft, nondistended, and nontender.  The bladder is not distended.  BACK:  He has no CVA tenderness.  GENITALIA:  The penis is circumcised.  The meatus is adequate.  The scrotum  is normal in appearance.  Both testicles, cords, and epididymis are within  normal limits.  RECTAL:  Examination deferred at this  time.   IMPRESSION:  1. Right renal mass.  2. Cholelithiasis.   PLAN:  The mass measures 1.9 x 1.6 x 2.8 cm and is contiguous to the  collecting system.  I believe that the patient would benefit from a radial  nephrectomy.  I have discussed with him also regarding another biopsy of the  renal mass  to confirm the diagnosis, although a negative biopsy does not exclude  malignancy.  The patient understands the treatment options and the risks and  benefits of the procedures.  He will discuss this with his family and let me  know of his decision.  Lindaann Slough, M.D.    MN/MEDQ  D:  05/27/2003  T:  05/27/2003  Job:  [id number]   cc:   Molly Maduro L. Foy Guadalajara, M.D.  [address] [place]  [place] [address]  Fax: [phone]

## 2010-07-03 NOTE — H&P (Signed)
NAME:  Scott Coleman, LASCOLA NO.:  [id number]   MEDICAL RECORD NO.:  [id number]                   PATIENT TYPE:  INP   LOCATION:  X009                                 FACILITY:  [facility] [place] [facility]   PHYSICIAN:  Lindaann Slough, M.D.               DATE OF BIRTH:  1935/09/09   DATE OF ADMISSION:  06/03/2003  DATE OF DISCHARGE:                                HISTORY & PHYSICAL   CHIEF COMPLAINT:  Abdominal pain.   HISTORY OF PRESENT ILLNESS:  The patient is a 75 year old male who was  admitted on May 24, 2003 at [facility] for severe right sided  upper abdominal pain associated with nausea and vomiting.  A CT scan of the  abdomen and pelvis showed gallstones and a solid mass in the upper pole of  the right kidney.  The patient was fully worked up and had general surgery  and urology consultations.  The patient needs a cystectomy and a  nephrectomy.  He was asymptomatic on May 31, 2003.  He was then discharged  home on April 15 and readmitted today for surgery.   PAST MEDICAL HISTORY:  1. History of hypertension.  2. Cerebrovascular accident with residual left sided weakness.   PAST SURGICAL HISTORY:  1. Right carotid endarterectomy three years ago.  2. Left total knee replacement.   SOCIAL HISTORY:  He is married and has one child.  He does not smoke nor  drink.   MEDICATIONS:  1. Aspirin 325 mg daily.  2. Medication for cholesterol.   FAMILY HISTORY:  Mother had a cerebrovascular accident.  Father is deceased.   REVIEW OF SYMPTOMS:  PULMONARY:  He has no cough, no shortness of breath and  no hemoptysis.  CARDIOVASCULAR:  He denies palpitations.  He has had no  chest pain.  GI:  He has had nausea and vomiting.  He had right upper  quadrant pain.  GU:  He has no frequency, hesitancy, dysuria or straining on  urination.   PHYSICAL EXAMINATION:  VITAL SIGNS:  Blood pressure is 149/74, pulse 76,  respirations 24, temperature 97.3.  HEENT:  His head is  normal.  Pupils equal, round and reactive to light.  Ears, nose and throat are within normal limits.  The neck is supple.  There  are no cervical nodes. There is no thyromegaly.  CHEST:  Symmetrical.  Lungs are fully expanded and clear to percussion and  auscultation.  CARDIAC:  Heart shows regular rhythm.  ABDOMEN:  Soft and it is not tender at this time.  He has no costovertebral  angle tenderness.  The kidneys are not palpable.  The bladder is not  distended.  Bowel sounds are normal.  GU EXAM:  The penis is circumcised.  The meatus is normal. The scrotum is  normal.  Both testicles, cords and epididymis are within normal limits.   CT scan showed a 1.9 x 1.6 x  2.8 cm mass in the upper pole of the right  kidney that is adjacent to the collecting system.  He also has multiple  gallstones.   IMPRESSION:  1. Right renal mass.  2. Cholelithiasis.  3. Hypertension.  4. Hypercholesterolemia.                                               Lindaann Slough, M.D.    MN/MEDQ  D:  06/03/2003  T:  06/03/2003  Job:  [id number]

## 2010-07-03 NOTE — Op Note (Signed)
[place]  Patient:    Scott Coleman, Scott Coleman                      MRN: [id number] Proc. Date: 07/07/00 Attending:  Di Kindle. Edilia Bo, M.D. CC:         Marinda Elk, M.D.   Operative Report  PREOPERATIVE DIAGNOSIS:  Greater than 80% right carotid stenosis with history of right cerebrovascular accident.  POSTOPERATIVE DIAGNOSIS:  Greater than 80% right carotid stenosis with history of right cerebrovascular accident.  OPERATION PERFORMED:  Right carotid endarterectomy with Dacron patch angioplasty.  SURGEON:  Di Kindle. Edilia Bo, M.D.  ASSISTANT:  Sherrie George, P.A.  ANESTHESIA:  General.  INDICATIONS FOR PROCEDURE:  The patient is a 75 year old gentleman who was found to have a carotid bruit by Dr. Foy Guadalajara.  This prompted a duplex scan which showed a greater than 80% right carotid stenosis.  He was sent over for further evaluation.  He did gave a history of a previous stroke in 1998 associated with left-sided weakness, in addition to an expressive aphasia and left facial droop.  At that time he did not have a significant carotid stenosis.  He has been on Coumadin since that time.  Right carotid endarterectomy was recommended in order to lower his risk of future stroke. The procedure and potential complications were discussed with the patient in detail in the office.  DESCRIPTION OF PROCEDURE:  The patient was taken to the operating room after an arterial line was placed by anesthesia.  The patient received a general anesthetic.  The right neck and upper chest were prepped and draped in the usual sterile fashion.  An incision was made along the anterior border of the sternocleidomastoid and the dissection carried down to the common carotid artery which was dissected free and controlled with a Rumel tourniquet.  The facial vein was divided between 2-0 silk ties and then the internal carotid artery was controlled above the plaque.  The  external carotid artery was also controlled with a blue vessel loop.  The patient was then heparinized.  Clamps were then placed on the internal, then the common, then the external carotid artery and then a longitudinal arteriotomy was made in the common carotid artery and extended through the plaque into the internal carotid artery.  An endarterectomy plane, first a 10 Bard shunt was placed into the internal carotid artery, back-bled and then placed into the common carotid artery and secured with a Rumel tourniquet.  Flow was re-established to to the shunt.  An endarterectomy plane was established proximally and the plaque was sharply divided.  Eversion endarterectomy was performed to the external carotid artery.  Distally, there was a nice taper in the plaque and no tacking sutures were required.  The vessel was irrigated with copious amounts of Dextran and all loose debris removed.  A Dacron patch was then sewn using a continuous 6-0 Prolene suture.  Prior to completing the patch closure, the shunt was removed, the vessels back-bled and flushed appropriately and the anastomosis completed. Flow was re-established first to the external carotid artery and then to the internal carotid artery.  There was a good Doppler signal and a good pulse distal to the patch.  Hemostasis was obtained in the wound and the wound was closed with a deep layer of 3-0 Vicryl.  The platysma was closed with running 3-0 Vicryl.  The skin was closed with a 4-0 subcuticular stitch.  A sterile dressing was applied.  The patient tolerated the procedure well and was transferred to the recovery room in satisfactory condition.  The sponge and needle counts were correct. DD:  07/07/00 TD:  07/07/00 Job: [id number]

## 2010-07-03 NOTE — H&P (Signed)
NAME:  Scott Coleman, DEEG NO.:  [id number]   MEDICAL RECORD NO.:  [id number]                   PATIENT TYPE:  INP   LOCATION:  1827                                 FACILITY:  [facility]   PHYSICIAN:  Deirdre Peer. Polite, M.D.              DATE OF BIRTH:  1935/04/09   DATE OF ADMISSION:  05/24/2003  DATE OF DISCHARGE:                                HISTORY & PHYSICAL   CHIEF COMPLAINT:  Right-sided abdominal pain.   HISTORY OF PRESENT ILLNESS:  Mr. Scott Coleman is a 75 year old gentleman with a  history of hypertension, CVA approximately five years ago with residual left-  sided weakness, left total knee replacement, as well as right carotid  endarterectomy about three years ago.  He presents to the ED after awakening  with acute abdominal pain at approximately 2 a.m. this morning.  The patient  states that the pain was sharp in nature (10/10), seemed to radiate to his  left side of his abdomen as well as towards his chest.  The patient also  experienced some nausea and had emesis x2.  He also experienced fever and  chills.  He denies any chest pain or shortness of breath or palpitations.  He states he has never had symptoms like this before.  The patient states  that he was in his usual state of health, but did eat out at a restaurant  last p.m.; however, no other sick contact.  He also has well water at home;  but again, no sick contacts.  Denies any diarrhea and denies any symptoms  like this before.  Does have intermittent gas and bloating.   In the ED the patient was evaluated.  Labs revealed leukocytosis; however  the patient was afebrile.  The patient underwent CAT scan of his abdomen and  pelvis, which showed a 3.2 cm abnormal ________ in the upper pole of the  right kidney.  This was not compatible with a cyst.  Other labs revealed  LFTs, amylase and lipase were within normal limits.   [facility] were called for further evaluation of this patient  and  possible admission.  The patient is alert and oriented in mild to moderate  distress, secondary to right lower quadrant pain.   PAST MEDICAL HISTORY:  As stated above:  1. Hypertension.  2. High cholesterol.  3. CVA approximately five years ago, with minimal residual left-sided     weakness.  4. Status post left total knee replacement three years ago.   MEDICATIONS ON ADMISSION:  1. Aspirin 325 mg q.d.  2. Cholesterol medications (the patient cannot remember the name of that     medication or the dosage).   SOCIAL HISTORY:  Negative for tobacco and no alcohol x10 years.  The patient  denies drugs.   PAST SURGICAL HISTORY:  1. Right carotid endarterectomy about three years ago.  2. Left total knee replacement.  Denies any appendectomy  or cholecystectomy.   FAMILY HISTORY:  Mother has CVA.  Patient's father is deceased.   ALLERGIES:  NO KNOWN DRUG ALLERGIES.   REVIEW OF SYSTEMS:  Stated in the HPI.  In addition, the patient denies any  headache, dizziness or chest pain.  No shortness of breath.  No dysuria.  No  change in his stools.  No weight loss.  No adenopathy.   PHYSICAL EXAMINATION:  GENERAL:  The patient is alert and oriented, in mild  to moderate distress with right quadrant pain.  VITAL SIGNS:  Temperature 97.5, blood pressure 146/76, pulse 69, respiratory  rate 24.  HEENT:  Anicteric sclerae.  No oral lesions.  NECK:  No JVD and no nodes.  The patient has obvious right carotid post-  surgical scar.  CHEST:  The patient has crackles at the right base.  CARDIOVASCULAR:  No notable S1 or S2; no S3.  ABDOMEN:  Positive bowel sounds.  No hepatosplenomegaly.  The patient has  pain with palpation in the right quadrant.  No mass could be appreciated.  BACK:  The patient has positive CVA tenderness on the right.  RECTAL:  Heme negative.  NEUROLOGIC:  Essentially nonfocal, except for left-sided weakness.   LABORATORY DATA:  CAT scan of the abdomen and pelvis  revealed a 3.0 cm area  of abnormal aspirate in the upper pole of the right kidney, compatible with  a cyst.  Area of edema related to focal pyelonephritis would be a  consideration, however area of isodense neoplasm could not be excluded.  Gallbladder is distended without stone, but no substantial pericholecystic  inflammatory change or fluid.  Tiny amount of perihepatic ascites.   CT of the pelvis reveals prostate gland enlarged; left inguinal hernia  containing only fat with no free fluid or lymphadenopathy.   CBC:  White count 14.1, hemoglobin 15.1, hematocrit 43.9, MCV 90,  neutrophils 91%.  URINALYSIS:  Specific gravity 1038, LE and nitrates  negative.  Amylase 88, lipase 20.  BMET within normal limits.  LFTs within  normal limits.   ASSESSMENT:  1. Right quadrant pain.  2. Nausea and vomiting.  3. Leukocytosis.  4. Abnormal CT showing 3.2 cm area of abnormal enhancement, pyelonephritis     versus neoplasm.  5. Dehydration, as shown by specific gravity on the urinalysis.   PLAN:  The patient will now be admitted to [facility].  Will give IV fluids,  IV antibiotics to cover for pyelonephritis.  Will obtain a urinalysis; blood  cultures for temperature greater 98.5.  Will obtain a MRI of the abdomen to  rule out neoplasm in the right kidney.   The patient will receive DVT and GI prophylaxis.  Will make further  recommendations after review of the above studies.                                                Deirdre Peer. Polite, M.D.    RDP/MEDQ  D:  05/24/2003  T:  05/24/2003  Job:  [id number]   cc:   Molly Maduro L. Foy Guadalajara, M.D.  [address] [place]  [place] [address]  Fax: [phone]

## 2010-07-03 NOTE — Discharge Summary (Signed)
[place]  Patient:    Scott Coleman, Scott Coleman Visit Number: [id number] MRN: [id number]          Service Type: SUR Location: [id number] Attending Physician:  Erasmo Leventhal Dictated by:   Druscilla Brownie Shela Nevin, P.A. Admit Date:  11/11/2000 Discharge Date: 11/15/2000   CC:         Di Kindle. Edilia Bo, M.D.  Marinda Elk, M.D.   Discharge Summary  ADMITTING DIAGNOSES: 1. Osteoarthritis, left knee. 2. Cerebrovascular accident, 35. 3. Umbilical hernia. 4. Status post endarterectomy May 2002, right neck.  DISCHARGE DIAGNOSES: 1. Osteoarthritis, left knee. 2. Cerebrovascular accident, 60. 3. Umbilical hernia. 4. Status post endarterectomy May 2002, right neck. 5. Mild postoperative anemia. 6. Mild hypokalemia, resolved.  CONSULTS:  None.  OPERATION:  On November 11, 2000, the patient underwent left total knee replacement arthroplasty. Dorie Rank, P.A. and Ottie Glazier. Wynona Neat, P.A.-C. assisted.  BRIEF HISTORY:  This 75 year old male with progressive problems concerning his left knee has failed conservative treatment with injections, nonsteroidal anti-inflammatories, and exercises. X-rays of the left knee have shown end stage osteoarthritis, and he has developed a varus malalignment. Loose bodies were also noted in the joint as well. Due to the fact that this is a very active gentleman, it was felt that he would benefit with surgical intervention and was admitted for total knee replacement arthroplasty.  COURSE IN HOSPITAL:  The patient tolerated surgical procedure quite well. Epidural narcotic regimen was used postoperatively under the direction of anesthesia. He did well with this and was placed in CPM machine and he achieved 60+ degrees prior to discharge. The Hemovac was removed on the first postop day without difficulty. He had some mild hypokalemia and Kay-Ciel was added to the IV fluids, and this stabilized. The  patients wound remained dry without any evidence of infection. The calf remained soft without evidence of DVT.  The patient was placed on Coumadin protocol postoperatively for the prevention of DVT. He was also switched to p.r.n. analgesics after the epidural was discontinued on November 13, 2000. On the day of discharge, he achieved stair walking, ambulating in the hall. He was quite confident that he could resume his therapy at home with the help of home physical therapy. Arrangements were made for this at discharge.  LABORATORY VALUES:  In the hospital hematologically showed a CBC with differential completely within normal limits on preadmission.  Final hemoglobin was 12.3, hematocrit was 36.2. Final INR was 1.3. Blood chemistries showed a drop in the postoperative potassium to 3.4. After adding it to his IV fluids (which was discontinued prior to discharge) his potassium came up to 4.4. Urinalysis was negative for urinary tract infection.  CHEST X-RAY:  Showed "no active disease." No electrocardiogram seen on this chart.  CONDITION ON DISCHARGE:  Improved, stable.  PLAN:  The patient was discharged to his home in the care of his family. He is to continue with his home medication and diet. He will be on Coumadin protocol for about 3 weeks, and then he is to resume aspirin as he had prior to his admission.  HOME MEDICATIONS:  For his orthopedic pain, will be: 1. Percocet 5 mg #50 1-2 q.4-6h. p.r.n. pain. 2. Robaxin 500 mg 1 q.6h. p.r.n. muscle spasm.  DISCHARGE INSTRUCTIONS:  Return to the center in 10-14 days. Dictated by:   Druscilla Brownie. Shela Nevin, P.A. Attending Physician:  Erasmo Leventhal DD:  11/15/00 TD:  11/15/00 Job: [id number]

## 2010-07-03 NOTE — Consult Note (Signed)
NAME:  Scott Coleman, TIEU NO.:  [id number]   MEDICAL RECORD NO.:  [id number]                   PATIENT TYPE:  INP   LOCATION:  5715                                 FACILITY:  [facility]   PHYSICIAN:  Ollen Gross. Vernell Morgans, M.D.              DATE OF BIRTH:  1936-02-06   DATE OF CONSULTATION:  05/29/2003  DATE OF DISCHARGE:                                   CONSULTATION   HISTORY:  Mr. Burstein is a 75 year old white male who was admitted several  days ago with severe right-sided upper abdominal pain. He had some nausea  and vomiting associated with this. Denied any fevers. He denied any fevers,  chills, chest pain, shortness of breath, diarrhea, hematuria. The pain was  very severe and took him to the emergency department. As part of his workup  he had a CT scan and ultrasound, which showed stones in the gallbladder, but  no gallbladder wall thickening or ductal dilatation. He was also identified  to have a right renal mass. He had not had any hematuria or flank pain that  he had noticed prior to his most recent pain. His other review of systems  was unremarkable.   PAST MEDICAL HISTORY:  1. Hypertension.  2. Carotid vascular disease.  3. Degenerative joint disease.   PAST SURGICAL HISTORY:  Left total knee replacement and right carotid  endarterectomy.   MEDICATIONS:  1. Aspirin.  2. A blood pressure medicine.  3. A cholesterol medicine.   ALLERGIES:  No known drug allergies.   SOCIAL HISTORY:  He denies any tobacco or alcohol.   FAMILY HISTORY:  Significant for a stroke in his mother.   PHYSICAL EXAMINATION:  VITAL SIGNS:  He is afebrile. His blood pressure is  160/90, pulse 57.  GENERAL:  Well-developed, well-nourished white male in no acute distress.  SKIN:  Warm and dry with no jaundice.  HEENT:  Eyes:  His extraocular muscles are intact. Pupils equal, round, and  reactive to light. Sclerae nonicteric.  LUNGS:  Clear bilaterally with no use of  accessory respiratory muscles.  HEART:  Regular rate and rhythm.  ABDOMEN:  Soft and nontender with no palpable masses or hepatosplenomegaly.  EXTREMITIES:  No cyanosis, clubbing, or edema.  PSYCHOLOGIC:  He is alert and oriented x3 with no evidence of anxiety or  depression.   LABORATORY DATA:  On review of his lab work his most recent liver functions  were from several days ago and were normal. He had a CT scan and an MRI  study that showed stones in the gallbladder as well as an ultrasound that  showed stones in the gallbladder and this right renal mass. He had a HIDA  scan that showed non-visualization of the gallbladder. His white count is  normal.   ASSESSMENT AND PLAN:  This is a 75 year old white male who probably had an  episode of biliary colic and possibly  a case of acute cholecystitis with  cholelithiasis that probably brought him to the hospital and as part of his  workup he was found to have a right renal mass. This renal mass is worrisome  for a renal cancer and the urologist has recommended that he have surgery. I  do think his symptoms initially, which have now resolved, are probably  related to his gallbladder and because of that I think he would benefit from  having his gallbladder removed as well. We will be happy to do this as a  joint operation with the urologist and will plan to coordinate with them. I  have discussed with them in detail the risks and benefits of the operation  to remove the gallbladder as well as some of the technical aspects and he  understands and wishes to proceed.                                               Ollen Gross. Vernell Morgans, M.D.    PST/MEDQ  D:  05/29/2003  T:  05/30/2003  Job:  [id number]

## 2010-07-03 NOTE — Discharge Summary (Signed)
NAME:  Scott Coleman, Scott Coleman NO.:  [id number]   MEDICAL RECORD NO.:  [id number]                   PATIENT TYPE:  INP   LOCATION:  5715                                 FACILITY:  [facility]   PHYSICIAN:  Hollice Espy, M.D.            DATE OF BIRTH:  1935/06/24   DATE OF ADMISSION:  05/24/2003  DATE OF DISCHARGE:  05/31/2003                                 DISCHARGE SUMMARY   CONSULTANTS ON THIS CASE:  1. Ollen Gross. Carolynne Edouard, M.D., general surgery.  2. Lindaann Slough, M.D., urology.   DISCHARGE DIAGNOSES:  1. Right renal mass suspicious for malignancy.  2. Cholecystitis.  3. Hyperglycemia.  4. Hypertension.  5. History of carotid vascular disease.  6. Degenerative joint disease.   DISCHARGE MEDICATIONS:  The patient takes aspirin at home and this  medication is to be held until following after his surgery.  He also takes  Lipitor.  He is being discharged on Visicol three tablets every 15 minutes  starting at noon on Sunday.   HISTORY OF PRESENT ILLNESS:  This is a 75 year old white male with a history  of hypertension, who was admitted on May 24, 2003, complaining of right-  sided upper abdominal pain associated with some nausea and vomiting.  At  that time, the pain was incredibly severe and the patient was seen in the ED  during his initial workup.  The patient had a CT scan and ultrasound  ordered.  IV antibiotics were started for suspected pyelonephritis.  His CT  scan of the abdomen and pelvis showed a 3 cm area of abnormal aspirate in  the upper pole of the right kidney compatible with a cyst and an area of  edema related to focal pyelonephritis, however, the possibility of an area  of isodense neoplasm could not be excluded.  The patient also noted that the  gallbladder distended without stone, but no substantial pericholecystic  inflammatory change or fluid, and a tiny amount of perihepatic ascites.   HOSPITAL COURSE:  #1 - RENAL FINDINGS:  The  patient's MRI of the abdomen and  pelvis did indeed end up showing gallstones.  It also did end up showing a  solid mass in the upper pole of the right kidney.  At this point, this was  highly suspicious for malignancy and Lindaann Slough, M.D., from urology was  consulted.  After evaluation with the MRI, which showed the mass measuring  1.9 x 1.6 x 2.8 cm contiguous to the collecting system, Dr. Brunilda Payor felt that  the patient would benefit from a radionephrectomy.  Biopsy was possibility,  however, biopsy would not necessarily exclude malignancy and the patient  understood these treatment options and agreed with a nephrectomy.  Furthermore, the plan was for the patient to have a nephrectomy done, but  then given the results of the MRI showing gallstones, a general surgery  consult was requested as well.   #2 -  CHOLECYSTITIS:  It was felt that this was indeed the source of the  patient's right upper quadrant pain.  A general surgery consult was  requested and the patient was evaluated for surgery on May 29, 2003, by  Ollen Gross. Carolynne Edouard, M.D., for [facility].  With communicative  efforts by both Dr. Brunilda Payor and Dr. Carolynne Edouard, the plan was then for the patient to  receive a joint dual operation.  The patient's surgery was scheduled over at  [facility] on Monday, June 03, 2003, in the morning.  Given the patient's  debility and initially his pain, nausea, and vomiting were all controlled,  the plan was for the patient to be discharged to home on Friday morning.  The patient was cleared for discharge both from a medical standpoint, as  well as from both surgery standpoints.  The plan will be for the patient to  follow up and report to [facility] on Monday, June 03, 2003, in the  morning for his surgeries.   #3 - HYPERGLYCEMIA:  The patient was noted to have a few elevated blood  sugar during his hospital stay.  Specifically, he was noted to have some  elevated serum glucose x 2 plus urine  glucose and urine ketones.  These  sugars were usually under 200, but greater than the normal elevated rate.  An A1C checked was found to be normal at 5.9.  It was felt likely this  elevated sugar was either secondary to stress from the patient's illness  and/or a possible minor obstruction from his gallbladder.  The best  recommendation in terms of his hyperglycemia would be followup once the  patient has recovered from his surgeries.   #4 - HYPERTENSION:  His blood pressure remained stable during his hospital  stay.   DISPOSITION:  The plan will be to discharge the patient to home.   CONDITION ON DISCHARGE:  Improved from when he first presented with nausea,  vomiting, and abdominal pain.   FOLLOWUP:  He will follow up at [facility] on Monday, June 03, 2003.  Prior to his admission, he will start Visicol prep at noon on Sunday,  three tablets every 15 minutes.  He will drink only clear liquids Sunday  after 12 noon and nothing by mouth after midnight on Sunday.  He will go to  [facility] at 10:30 a.m.  In the meantime, his activity will be to  rest with minimal exertion.  Following the patient's surgeries, he may then  follow up with his PCP, Dr. Foy Guadalajara.   DIET:  He is continue a low-sodium diet until the above diet starting on  Sunday afternoon.                                                Hollice Espy, M.D.    SKK/MEDQ  D:  05/31/2003  T:  06/01/2003  Job:  [id number]   cc:   Dewaine Oats, M.D.   Lindaann Slough, M.D.  [address]  [place]  [place] [address]  Fax: [phone]   Ollen Gross. Vernell Morgans, M.D.  [address]  [place]  [place] [address]  Fax: [phone]

## 2010-07-03 NOTE — H&P (Signed)
[place]  Patient:    Scott Coleman, Scott Coleman                      MRN: [id number] Adm. Date:  07/07/00 Attending:  Di Kindle. Edilia Bo, M.D. Dictator:   Dominica Severin, P.A. CC:         Marinda Elk, M.D.  Benny Lennert, M.D.   History and Physical  DATE OF BIRTH:  January 06, 1936  CHIEF COMPLAINT:  Right ECCVOD - carotid artery disease.  HISTORY OF PRESENT ILLNESS:  This is a 75 year old Caucasian male referred by Dr. Foy Guadalajara for evaluation of carotid artery disease.  Was seen by Dr. Edilia Bo for evaluation of a greater than 80% right carotid artery stenosis on Jun 22, 2000 in the office.  This patient is status post a right hemispheric stroke in 1998 associated with left-sided weakness and numbness as well as an expressive aphasia with a left facial droop.  At the time patient only had a 40% right carotid stenosis.  He was placed on Coumadin at the time.  He now presents to Dr. Edilia Bo after found to have a right-sided bruit after a preoperative evaluation at Dr. Dalbert Mayotte office.  Patient is now scheduled for a right carotid endarterectomy on Jul 07, 2000.  Patient does state that he the past few days has had a frontal as well as a ______ neck headache and he does state he had some speech impairment with some expressive aphasia, as well as some memory loss and confusion which seems to get worse when he is tired.  He also does have a left-sided facial droop.  Patient did have a presyncopal episode pre-CVA in 1998 as well as some memory loss with the CVA and numbness and tingling on his left lower extremities.  Patient denies any nausea, vomiting, vertigo, dizziness, history of fall or seizures.  Denies any muscle weakness, dysphagia, syncope, or current confusion or vision changes.  Patient does wear glasses.  PAST MEDICAL HISTORY: 1. Carotid artery disease. 2. CVA in 1998 with left-sided numbness/weakness. 3. Osteoarthritis of the left  knee. 4. Hyperlipidemia. 5. Post stroke depression which has now resolved.  PAST SURGICAL HISTORY:  None.  CURRENT MEDICATIONS: 1. Zyprexa 5 mg once a day. 2. Multivitamin daily. 3. Coumadin 5 mg one-half Monday, Wednesday, Friday and one tablet Tuesday,    Thursday, Saturday, and Sunday - which was discontinued on Jun 29, 2000. 4. Lipitor 10 mg once a day. 5. Coated aspirin 325 mg once a day - which was started on Jun 30, 2000.  ALLERGIES:  No known drug allergies.  REVIEW OF SYSTEMS:  Please see HPI for significant positives.  Patient denies any history of diabetes mellitus, kidney disease, asthma, myocardial infarction, or coronary artery disease, hypertension, or congestive heart failure.  FAMILY HISTORY:  Mother is living.  She is 49 years old, does not have any significant medical problems except for osteoarthritis.  Father is deceased of coronary artery disease at 73 years old.  There is no history of premature heart disease in his family.  His sister is deceased in her 51s of cancer of an unknown cause and his other sister is alive and well although just had a replaced valve in her heart approximately three to four years ago.  Also, he had a brother who is deceased at 62 years old of alcohol abuse.  SOCIAL HISTORY:  Patient is married, has one child who is alive and well, does not  have any grandchildren.  He is retired.  Currently denies any alcohol abuse or use.  He does have a history of chewing tobacco use, use approximately one pouch every two to three days and in the past had chewed on cigar although denies any current tobacco abuse.  PHYSICAL EXAMINATION:  GENERAL:  This is a healthy-appearing 75 year old Caucasian male in no acute distress.  He is pleasant.  Is alert and oriented x 3.  VITAL SIGNS:  Blood pressure 142/80, pulse 72, respirations 18.  HEENT:  Head is normocephalic, atraumatic.  Eyes show PERRLA/EOMI.  There are no cataracts, glaucoma, or  macular degeneration noted at this time.  There is a facial droop on the left side which is noted.  NECK:  Supple without JVD, has a bruit on the right and is without lymphadenopathy or thyromegaly.  CHEST:  Clear to auscultation bilaterally.  Lungs are without wheezes, rhonchi, rales, or lymphadenopathy.  HEART:  Regular rate and rhythm.  No murmurs, gallops, or rubs are appreciated at this time.  ABDOMEN:  Soft, nontender, with positive bowel sounds x 4 quadrants without any masses or bruits.  GENITOURINARY/RECTAL:  Deferred.  EXTREMITIES:  Without clubbing, cyanosis, edema, or ulcerations.  His extremities are warm.  PULSES:  His carotid pulses are 2+ and equal bilaterally.  Femoral, popliteal, dorsalis pedis pulses are 2+ and equal bilaterally.  NEUROLOGIC:  There are no focal deficits.  Cranial nerves 2-12 are grossly intact.  Deep tendon reflexes are 2+ bilaterally.  Gait is somewhat unsteady. Patient does walk with a limp and muscle strength is 4+ and equal bilaterally.  ASSESSMENT AND PLAN:  Right carotid artery stenosis for a right carotid endarterectomy on Jul 07, 2000.  Dr. Edilia Bo has seen and evaluated this patient prior to this admission and has explained the risks and benefits of the procedure and the patient has agreed to continue. DD:  07/05/00 TD:  07/05/00 Job: [id number]

## 2010-07-03 NOTE — Op Note (Signed)
NAME:  Scott Coleman, Scott Coleman NO.:  [id number]   MEDICAL RECORD NO.:  [id number]                   PATIENT TYPE:  AMB   LOCATION:  DAY                                  FACILITY:  [facility]   PHYSICIAN:  Ollen Gross. Vernell Morgans, M.D.              DATE OF BIRTH:  08-28-1935   DATE OF PROCEDURE:  09/13/2003  DATE OF DISCHARGE:  09/13/2003                                 OPERATIVE REPORT   PREOPERATIVE DIAGNOSES:  Umbilical hernia.   POSTOPERATIVE DIAGNOSES:  Umbilical hernia.   PROCEDURE:  Umbilical hernia repair.   SURGEON:  Ollen Gross. Carolynne Edouard, M.D.   ANESTHESIA:  General via LMA.   DESCRIPTION OF PROCEDURE:  After informed consent was obtained, the patient  was brought to the operating room, placed in a supine position on the  operating table. After adequate induction of general anesthesia, the  patient's abdomen was prepped with Betadine and draped in the usual sterile  manner.  A small curvilinear infraumbilical incision was made with a 10  blade knife. This incision was carried down through the skin and  subcutaneous tissue sharply with the electrocautery.  Blunt dissection was  then carried out to identify the hernia sac from the rest of the  subcutaneous tissue.  The hernia sac was peeled away from the underside of  the umbilicus. The hernia sac was opened and some adhesions of omentum to  the inside of the hernia sac were broken up sharply with the electrocautery  until the edges of the hernia sac were able to be easily identified and were  cleared of any debris and hernia sac had been resected. The defect actually  turned out to be less than a centimeter in diameter. The hernia defect was  repaired with several #0 Prolene interrupted stitches. The repair appeared  to be without tension and with good approximation. The fascia appeared to be  healthy. At this point, the wound was irrigated with copious amounts of  saline, the subcutaneous tissue was closed with  interrupted 3-0 Vicryl  stitches and the skin was closed with a running 4-0 Monocryl subcuticular  stitch. Benzoin and Steri-Strips, sterile cotton balls and sterile dressings  were applied.  The patient tolerated the procedure well.  At the end of the  case, all sponge, needle and instrument counts were correct.  The patient  was then awakened and taken to the recovery room in stable condition.                                               Ollen Gross. Vernell Morgans, M.D.    PST/MEDQ  D:  09/15/2003  T:  09/15/2003  Job:  [id number]

## 2010-07-03 NOTE — Op Note (Signed)
[place]  Patient:    Scott Coleman, Scott Coleman Visit Number: [id number] MRN: [id number]          Service Type: SUR Location: [id number] Attending Physician:  Erasmo Leventhal Dictated by:   Elvera Lennox. Valma Cava, M.D. Proc. Date: 11/11/00 Admit Date:  11/11/2000                             Operative Report  PREOPERATIVE DIAGNOSIS:  Left knee end-stage osteoarthritis.  POSTOPERATIVE DIAGNOSIS:  Left knee end-stage osteoarthritis.  PROCEDURE:  Left total knee arthroplasty.  SURGEON:  R. Valma Cava, M.D.  ASSISTANT:  Dorie Rank, P.A., Ottie Glazier. Wynona Neat, P.A.-C.  ANESTHESIA:  General followed by epidural.  ESTIMATED BLOOD LOSS:  Less than 50 cc.  DRAINS:  Two mini-Hemovacs.  COMPLICATIONS:  None.  TOURNIQUET TIME:  One hour and 50 minutes at 350 mmHg.  OPERATIVE IMPLANTS:  Osteonics components, all cemented.  Posterior stabilized.  Size 7 femur, size 7 tibia, 12-mm tibial insert, 26-mm patella.  OPERATIVE DETAILS:  The patient was counseled in the holding area.  Correct side was identified.  Chart was reviewed and signed appropriately.  IV started and antibiotics given.  Taken to the OR, placed in supine position. General anesthesia.  Foley catheter was placed using usual sterile technique by the OR circulating nurse.   Full extension-flexion to 130.  Elevated.  Prepped with Duraprep and all in a sterile fashion. Exsanguinated supply and tourniquet inflated to 350 mmHg.  A standard straight midline incision was made through the skin and subcutaneous tissue. Skin slaps were developed.  Hemostasis obtained.  The medial parapatellar arthrotomy was performed.  The patellar was everted.  The knee was then flexed.  Also did a soft tissue release.  Medial tibial end-stage osteoarthritis bone-on-bone contact.  Cruciate ligaments were resected.  There were some loose bodies notch.  Knees were removed.  Starting hole made in distal femur.   Canals irrigated.  Intermedullary rod was gently placed and it took a 5-degree valgus cut and then an 8-mm cut off the distal femur.  The distal femur was found to be size #7.  Rotation marks were made.  Distal femur was cut to fit a size #7.  Medial and lateral meniscii removed. Geniculate vessels were coagulated.  Tibia was resected and osteophytes removed from the proximal tibia.  The proximal tibia was found to be a size #7.  A starting hole was made.  Canal was irrigated.  Intermedullary rod was gently placed after the canal was vented.  We chose a 5-degree posterior slope and took a 10-mm cut based upon the lateral side.  Posterior medial and lateral osteophytes were removed under direct visualization.  Femoral trochlear was prepared in a standard fashion.  With trial implants in place, size #7 femur and size #7 tibia is fit tight in flexion and extension.  The trials were removed and another 2 mm was taken off the proximal tibia with a 5-degree posterior slope.  At this point in time, he had excellent range of motion, soft tissue balance in flexion-extension, and the flexion-extension gaps were well balanced.  Rotational marks were made in the proximal tibia and the Deltafit Keel was performed in a standard fashion.   The patella was found to be a size #26.  It was 24-mm in thickness.  It was reamed to a depth of 10 mm.  Locking holes were made and excess bone  was removed.  At this point in time, utilizing modern cement technique, all components were cemented into place:  size #7 tibia, size #7 femur, with a 26-mm patella.  After the cement had cured, we went through trials of 10- and 12-mm thickness with a 12-mm thickness insert.  We had excellent range of motion, soft tissue balance, and flexion-extension, and patellar-femoral tracking was anatomic. The trial was removed and the final insert was placed, 12 mm thickness.  Bone wax was placed over exposed bony surfaces.   Hemostasis was obtained.  Two mini-Hemovac drains were placed.  Each layer was irrigated with antibiotic solution during the closure.  A sequential closure of layers were done. Benzoin and Steri-Strips were applied.  Drains were hooked up to suction.  Sterile compressive dressing was applied.  The tourniquet was deflated.  He had normal circulation and pulses in the ankle in the case.  He was given another Ancef intravenously.  The tourniquet deflated.  Sponge and needle counts were correct.  There were no complications.  He had and epidural catheter that was then placed by the anesthesiologist.  Awakened, x-rayed, and taken from the operating room to PACU in stable conditon.  No complications.  Sponge and needle counts were correct. Dictated by:   R. Valma Cava, M.D. Attending Physician:  Erasmo Leventhal DD:  11/11/00 TD:  11/11/00 Job: [id number]

## 2010-07-03 NOTE — Op Note (Signed)
NAME:  Scott Coleman, Scott Coleman NO.:  [id number]   MEDICAL RECORD NO.:  [id number]                   PATIENT TYPE:  INP   LOCATION:  0357                                 FACILITY:  [facility]   PHYSICIAN:  Ollen Gross. Vernell Morgans, M.D.              DATE OF BIRTH:  08/27/1935   DATE OF PROCEDURE:  06/03/2003  DATE OF DISCHARGE:  06/07/2003                                 OPERATIVE REPORT   PREOPERATIVE DIAGNOSES:  1. Gallstones.  2. Right renal mass.   POSTOPERATIVE DIAGNOSES:  1. Right renal mass.  2. Gallstones.  3. Cholecystitis.   PROCEDURE:  Open cholecystectomy.   SURGEON:  Ollen Gross. Carolynne Edouard, M.D.   ASSISTANT:  1. Dr. Brunilda Payor.  2. Dr. Daphine Deutscher.   ANESTHESIA:  General endotracheal.   PROCEDURE:  After informed consent was obtained, the patient was brought to  the operating room, placed in a right side, slightly up, lateral position  and all pressure points were padded.  His abdomen was then prepped with  Betadine, draped in the usual sterile manner.  A right subcostal incision  was made by the urologists who were operating on him for a right renal mass.  He had been known to have gallstones and was interested in having these  removed.  It was unclear whether his right-sided abdominal pain could have  been coming from the gallstones or the right renal mass.  After entering the  abdomen, the gallbladder was identified at the lower edge of the liver.  The  gallbladder was significantly inflamed and had somewhat of a gangrenous  appearance.  The gallbladder was dissected away from the liver bed by  initially opening a plane on the back wall of the gallbladder using the  electrocautery.  Blunt finger dissection was able to be used to gently  separate the gallbladder from the liver bed near the base of the  gallbladder.  The gallbladder wall became paper thin and somewhat fell  apart.  Some bleeding was encountered in this area which was able to be  controlled with  vascular clips.  Because of the amount of inflammation in  the area, we were unable to really control the gallbladder neck area.  The  stones within the gallbladder were removed and it was very difficult to find  the opening of the cystic duct to obtain a cholangiogram, and because of  this the cholangiogram portion of the procedure was aborted.  There was no  leaking or backflow of bile through the cystic duct stump.  At this point,  the nephrectomy portion of the case was performed by Dr. Brunilda Payor and at the end  of the case a 72 French __________ drain was brought through the abdominal  wall below the incision with a tonsil clamp.  The drain was placed in the  liver bed.  Again there was no backflow of bile coming from the area.  The  drain was anchored to the skin using a 3-0 nylon stitch.  The abdomen was  then irrigated with copious amounts of saline and the incision was then  closed.  The fascia of the anterior abdominal wall was closed in two layers  of running #1 PDS suture.  Each layer was irrigated as it was closed and the  skin was closed with staples.  Sterile dressings were applied.  The patient tolerated the procedure well.  At the end of the case, all needle, sponge, instrument counts were correct.   Dr. Madilyn Hook portion of the procedure will be dictated separately.                                               Ollen Gross. Vernell Morgans, M.D.    PST/MEDQ  D:  08/28/2003  T:  08/28/2003  Job:  [id number]

## 2010-07-03 NOTE — Discharge Summary (Signed)
NAME:  WAYLYN, TENBRINK NO.:  [id number]   MEDICAL RECORD NO.:  [id number]                   PATIENT TYPE:  INP   LOCATION:  0357                                 FACILITY:  [facility]   PHYSICIAN:  Lindaann Slough, M.D.               DATE OF BIRTH:  08-13-1935   DATE OF ADMISSION:  06/03/2003  DATE OF DISCHARGE:  06/07/2003                                 DISCHARGE SUMMARY   DISCHARGE DIAGNOSES:  1. Right renal cell carcinoma.  2. Chronic cholecystitis and cholelithiasis.   PROCEDURE DONE:  Right radical nephrectomy and cholecystectomy on June 03, 2003.   The patient is a 75 year old male who was admitted to [facility] on  May 24, 2003, for severe right-sided upper abdominal pain associated with  nausea and vomiting. As part of the workup he had CT scan of the abdomen and  pelvis that showed gallstones and a solid mass in the mid pole of the right  kidney. The patient was discharged from [facility] on May 31, 2003, and re-  admitted at [facility] on June 03, 2003, for cholecystectomy and  nephrectomy.   On physical examination, blood pressure was 149/74, pulse 76, respirations  24, temperature 97.3. Head was normal. Pupils were equal and reactive to  light. Chest was symmetrical. Lungs were fully expanded and clear to  auscultation and percussion. Heart had a regular rhythm. Abdomen was soft,  nondistended, nontender; no CVA tenderness. Kidney not palpable. Bladder not  distended. Bowel sounds normal. Penis circumcised and meatus normal. Scrotum  was normal in appearance. Both testicles, cord, and epididymis within normal  limits.   His hemoglobin was 14.4, hematocrit 42.1, WBC 8.7. Sodium 134, potassium  3.8, glucose 123, BUN 10, creatinine 0.9.   Chest x-ray showed no evidence of active disease. EKG showed normal sinus  rhythm and nonspecific ST changes.   The patient had a radical nephrectomy and cholecystectomy on June 03, 2003.  His postoperative course was uneventful. He remained afebrile throughout his  hospital course. He was started on a liquid diet on the second day  postoperatively which he tolerated well and his diet was gradually advanced.  His Foley catheter was removed on the second day postoperatively and he was  voiding well. His urine was clear. The Blake drain was draining  serosanguineous fluid and the drain was left in place.  On June 07, 2003,  he was afebrile, eating well, and was then discharged home on Vicodin one or  two tablets q.4h. p.r.n. pain and his home medications.   DISCHARGE DIET:  Regular.   CONDITION ON DISCHARGE:  Improved.   The patient is instructed not to do any lifting, straining, or driving until  further advised. He will return to Dr. Billey Chang office to have the drain  removed and skin staples will also be removed next week.   The patient will be followed in  my office in about three weeks.                                               Lindaann Slough, M.D.    MN/MEDQ  D:  06/07/2003  T:  06/08/2003  Job:  [id number]   cc:   Ollen Gross. Vernell Morgans, M.D.  [address]  [place]  [place] [address]  Fax: [phone]   Doris Cheadle. Foy Guadalajara, M.D.  [address] [place]  [place] [address]  Fax: [phone]   Deirdre Peer. Polite, M.D.

## 2010-07-03 NOTE — Op Note (Signed)
NAME:  DELMO, MATTY NO.:  [id number]   MEDICAL RECORD NO.:  [id number]                   PATIENT TYPE:  INP   LOCATION:  0357                                 FACILITY:  [facility]   PHYSICIAN:  Lindaann Slough, M.D.               DATE OF BIRTH:  March 28, 1935   DATE OF PROCEDURE:  06/03/2003  DATE OF DISCHARGE:                                 OPERATIVE REPORT   PREOPERATIVE DIAGNOSES:  1. Right renal mass.  2. Cholelithiasis.   POSTOPERATIVE DIAGNOSES:  1. Right renal mass.  2. Cholelithiasis.   PROCEDURE:  1. Right radial nephrectomy.  2. Cholecystectomy.   SURGEON:  Lindaann Slough, M.D. and Ollen Gross. Carolynne Edouard, M.D.   ASSISTANT:  Thyra Breed, MD   ANESTHESIA:  General endotracheal.   ESTIMATED BLOOD LOSS:  800 mL.   DRAINS:  39 French Foley catheter to straight drain, 19 [place] Blake drain  to bulb suction.   COMPLICATIONS:  None.   INDICATIONS FOR PROCEDURE:  Mr. Hirschman is a pleasant 75 year old male who  underwent a CT scan of the abdomen and pelvis as well as an MRI in early  April 2005 following admission for severe abdominal pain. This showed a  solid mass in the upper pole of the right kidney.  In addition, it showed  numerous stones in the gallbladder.  The patient was also evaluated by  general surgery for his gallbladder as the source of his abdominal pain.  Given the fact that there were numerous stones within the gallbladder on  scan, it was decided the patient would likely benefit from cholecystectomy  at the time of a right radical nephrectomy.  Regarding the renal mass, the  risks, benefits, and alternatives of a right radical nephrectomy were  discussed with the patient including bleeding, infection, damage to adjacent  structures, future renal problems or mortality. The patient understands  these risks and is willing to proceed with a combined procedure including a  right radical nephrectomy and cholecystectomy.   DESCRIPTION  OF PROCEDURE:  Following identification by his arm bracelet, the  patient was brought to the operating room and placed in the supine position.  Here he received successful induction of general endotracheal anesthesia and  was given preoperative IV antibiotics.  A small bump was placed beneath the  patient's right flank to allow better exposure of the kidney.  The right  side of the upper and lower abdomen as well as the flank were then shaved.  The entire abdomen was then prepped with Betadine and draped in the usual  sterile fashion.  A scalpel was used to make a skin incision approximately  two fingerbreadths below the costal margin extending prior to reaching the  mid axillary line with its medial extent proximal to the xiphisternum.  Bovie electrocautery was then used to carry the incision down through the  ensuing muscle layers.  The peritoneum was then entered.  Once the liver and  gallbladder were isolated, the self retaining Bookwalter retractor was  utilized.  Using malleable retractors and body wall blades, excellent  exposure of the gallbladder was obtained.  The gallbladder was somewhat  necrotic and thickened in appearance.  It was rather large and there was  omentum that had become slightly discolored overlying the gallbladder.  At  this point, Dr. Carolynne Edouard of general surgery took over the procedure to perform  cholecystectomy.  For a full summary of this portion of the procedure,  please see the separate dictated operative note by general surgery.   Following completion of the cholecystectomy, we then proceeded with the  right radical nephrectomy.  Using the Bovie and right angled dissection, the  colon was then reflected both medially and inferiorly to reveal Gerota's  fascia.  With minimal blunt dissection, we were able to expose the renal  vein.  Actually, there appeared to be a single renal vein was isolated  coming off the vena cava which shortly demonstrated a trifurcation  into the  renal hilum.  Metzenbaum scissors as well as right angled dissection were  used to clean the renal vein such that a right angled clamp could be safely  passed beneath it. A blue vessiloop was then passed and a hemostat used to  affix the vessiloop such that identification of the renal vein could be  maintained.  With excellent exposure of the renal hilum, we continued our  dissection using right angled dissection until the first of three renal  arteries was encountered. Again Metzenbaum scissors and forceps were used to  clean the surrounding adherent tissue from the artery. A right angled clamp  was then easily passed beneath the artery.  A 1-0 silk tie on a pass was  then utilized to triply ligate this renal artery.  In addition, a large clip  was placed on the most proximal portion of the artery.  Between the distal  two ties, a scalpel on a long handled knife was used to transect the artery.  We repeated this procedure on two additional renal arteries which were  encountered. Again these were triply ligated also using a metal large clip  at the most proximal aspect.  Following this, we passed 1-0 silk suture  beneath the isolated renal vein. Again the renal vein was triply ligated and  a large metal clip used on the most proximal portion near the inferior vena  cava.  The renal vein was then transected.  Excellent hemostasis was  maintained throughout this portion of the procedure.  We then began to free  the inferior portion of the kidney within Gerota's fascia. The ureter was  encountered, this was carefully dissected out using the right angle.  1-0  silk suture was again used to ligate the ureter at its most distal aspect  accessible from the incision.  The ureter was then transected, further blunt  dissection was used to free the inferior portion of the kidney as well as  the lateral and posterior aspect. Superiorly, the adrenal gland was incorporated into the nephrectomy  specimen, large clips were used to control  any further vessels before freeing the entire superior aspect of the kidney.  Once the kidney was freely mobile, it was removed from the surgical bed and  passed off the field for pathologic analysis.  The wound was then copiously  irrigated and any further bleeding was controlled with Bovie electrocautery.  Excellent hemostasis had been obtained.  A stab incision  was then made  beneath the subcostal incision and a 19 [place] Blake drain was placed to  incorporate the nephrectomy bed but also the most distal portion placed in  the area of the surgically removed gallbladder.  Omentum was then placed  around the site of cholecystectomy.  One piece of Surgicel was placed at the  base of the liver near the superior aspect of the nephrectomy specimen for  some minimal oozing.  It should be mentioned at closure, there was no  evidence of active bleeding at this site or elsewhere.  The incision was  then closed anatomically in two layers with a #1 running PDS suture.  The  skin was reapproximated with surgical clips.  All sponge, needle and  instrument counts were correct x2.  The patient tolerated the procedure well  and there were no complications.  Please note that Dr. Brunilda Payor was present and  participated in the entire procedure as he was the responsible surgeon.   DISPOSITION:  After awaking from general anesthesia, the patient was  transported to the post anesthesia care unit in stable condition.  From  here, he will be transferred to the floor for further postoperative  observation.     Thyra Breed, MD                            Lindaann Slough, M.D.    EG/MEDQ  D:  06/03/2003  T:  06/04/2003  Job:  [id number]

## 2010-07-03 NOTE — H&P (Signed)
[place]. [facility]  Patient:    Scott Coleman, Scott Coleman. Visit Number: [id number] MRN: [id number]          Service Type: Attending:  R. Valma Cava, M.D. Dictated by:   Dorie Rank, P.A. Adm. Date:  11/11/00   CC:         Di Kindle. Edilia Bo, M.D.  Marinda Elk, M.D.   History and Physical  DATE OF BIRTH:  02/19/2035  CHIEF COMPLAINT:  Left knee pain.  HISTORY OF PRESENT ILLNESS:  Mr. Duffner is a pleasant, 75 year old, white male who has had ongoing left knee pain for some time now.  He has failed conservative treatment.  The pain is to the point where it is interfering with his activities of daily living.  Left knee radiographs on October 19, 2000, revealed end-stage osteoarthritis with a varus malalignment.  Multiple loose bodies were noted in the joint.  It was felt due to the diagnostic studies, as well as failure of conservative treatment that he would benefit from undergoing a l total knee arthroplasty.  The risks and benefits, as well as the procedure were discussed with the patient and he elected to proceed.  He was given a letter of medical clearance by Marinda Elk, M.D.  MEDICATIONS: 1. Aspirin 325 mg one p.o. q.d. 2. Lipitor 10 mg one p.o. q.d. 3. Zyprexa 5 mg one p.o. q.d. 4. Multivitamins one p.o. q.d. 5. Glucosamine with chondroitin one p.o. q.d.  ALLERGIES:  No known drug allergies.  PAST MEDICAL HISTORY:  Significant for cerebrovascular accident in 1998.  He had rheumatic fever as a child.  He does have an umbilical hernia. Osteoarthritis as mentioned above.  He did recently have a carotid endarterectomy in May of 2002.  SOCIAL HISTORY:  The patient is married.  Accompanied by his wife today.  He has one child.  He denies any alcohol or tobacco use.  He lives in a one-level home.  He plans for home health physical therapy.  PAST SURGICAL HISTORY:  Tonsillectomy as a child.  Endarterectomy of the right carotid artery in  May of 2002.  FAMILY HISTORY:  Mother living, age 81, has a history of osteoarthritis. Father deceased at age 36 with no medical history available.  REVIEW OF SYSTEMS:  General:  No fevers, chills, night sweats, or bleeding tendencies.  GI:  No constipation, diarrhea, melena, nausea, or vomiting.  GU: No hematuria, dysuria, or discharge.  Cardiovascular:  No chest pain, angina, or orthopnea.  Pulmonary:  He has an occasional cough at night.  No productive cough or hemoptysis.  Endocrine:  Negative for a history of diabetes mellitus or thyroid disease.  Neurologic:  No recent seizures, headaches, or paralysis. No blurred or double vision.  CVA in 1998 as mentioned above.  PHYSICAL EXAMINATION:  GENERAL APPEARANCE:  A well-developed, well-nourished, 75 year old, white male, alert and oriented.  VITAL SIGNS:  Pulse 80, respirations 20, blood pressure 150/80.  HEENT:  The head is normocephalic and atraumatic.  The oropharynx is clear.  NECK:  Supple.  Negative for carotid bruits bilaterally.  CHEST:  Clear to auscultation bilaterally.  No wheezes, rales, or rhonchi.  BREASTS:  Not pertinent to present illness.  HEART:  S1 and S2 negative for murmurs, rubs, or gallops.  HEART:  Regular in rate and rhythm.  ABDOMEN:  Soft and nontender.  Positive bowel sounds in all four quadrants.  GENITOURINARY:  Not pertinent to present illness.  EXTREMITIES:  Dorsalis pedis pulses 2+ bilaterally.  Posterior  tibialis pulses 1+ bilaterally.  The patient does walk with an antalgic gait.  He has crepitants with range of motion to the left knee.  He does have a varus deformity to the left knee.  Range of motion to the left knee is 6-110 degrees.  He has diffuse tenderness medially along the left knee.  SKIN:  Acyanotic.  No rashes or lesions appreciated on exam.  He does have a scar to the right lateral neck.  PREOPERATIVE LABORATORY DATA AND X-RAYS:  Pending at this time.  IMPRESSION: 1.  Osteoarthritis to the left knee. 2. Cerebrovascular accident in 1998. 3. Umbilical hernia. 4. Endarterectomy in May of 2002.  PLAN:  The patient is scheduled for a left total knee arthroplasty with R. Valma Cava, M.D. Dictated by:   Dorie Rank, P.A. Attending:  R. Valma Cava, M.D. DD:  11/03/00 TD:  11/03/00 Job: [id number]

## 2010-07-03 NOTE — Op Note (Signed)
NAME:  Scott Coleman, Scott Coleman NO.:  [id number]   MEDICAL RECORD NO.:  [id number]          PATIENT TYPE:  AMB   LOCATION:  [facility]                         FACILITY:  [facility]   PHYSICIAN:  Lindaann Slough, M.D.  DATE OF BIRTH:  03/02/1935   DATE OF PROCEDURE:  03/30/2004  DATE OF DISCHARGE:                                 OPERATIVE REPORT   ADDENDUM:  Please send copy of operative report to Robert L. Foy [place], M.D.      MN/MEDQ  D:  03/30/2004  T:  03/30/2004  Job:  [id number]

## 2014-08-01 DIAGNOSIS — Z96652 Presence of left artificial knee joint: Secondary | ICD-10-CM | POA: Diagnosis not present

## 2014-08-01 DIAGNOSIS — E1122 Type 2 diabetes mellitus with diabetic chronic kidney disease: Secondary | ICD-10-CM | POA: Diagnosis not present

## 2014-08-01 DIAGNOSIS — N183 Chronic kidney disease, stage 3 (moderate): Secondary | ICD-10-CM | POA: Diagnosis not present

## 2014-08-01 DIAGNOSIS — I129 Hypertensive chronic kidney disease with stage 1 through stage 4 chronic kidney disease, or unspecified chronic kidney disease: Secondary | ICD-10-CM | POA: Diagnosis not present

## 2014-08-01 DIAGNOSIS — E782 Mixed hyperlipidemia: Secondary | ICD-10-CM | POA: Diagnosis not present

## 2014-08-01 DIAGNOSIS — M1711 Unilateral primary osteoarthritis, right knee: Secondary | ICD-10-CM | POA: Diagnosis not present

## 2014-12-16 DIAGNOSIS — Z23 Encounter for immunization: Secondary | ICD-10-CM | POA: Diagnosis not present

## 2015-01-31 DIAGNOSIS — E782 Mixed hyperlipidemia: Secondary | ICD-10-CM | POA: Diagnosis not present

## 2015-01-31 DIAGNOSIS — I700 Atherosclerosis of aorta: Secondary | ICD-10-CM | POA: Diagnosis not present

## 2015-01-31 DIAGNOSIS — M8580 Other specified disorders of bone density and structure, unspecified site: Secondary | ICD-10-CM | POA: Diagnosis not present

## 2015-01-31 DIAGNOSIS — I10 Essential (primary) hypertension: Secondary | ICD-10-CM | POA: Diagnosis not present

## 2015-01-31 DIAGNOSIS — Z96652 Presence of left artificial knee joint: Secondary | ICD-10-CM | POA: Diagnosis not present

## 2015-01-31 DIAGNOSIS — E1122 Type 2 diabetes mellitus with diabetic chronic kidney disease: Secondary | ICD-10-CM | POA: Diagnosis not present

## 2015-01-31 DIAGNOSIS — I2510 Atherosclerotic heart disease of native coronary artery without angina pectoris: Secondary | ICD-10-CM | POA: Diagnosis not present

## 2015-01-31 DIAGNOSIS — N183 Chronic kidney disease, stage 3 (moderate): Secondary | ICD-10-CM | POA: Diagnosis not present

## 2015-02-04 DIAGNOSIS — Z8546 Personal history of malignant neoplasm of prostate: Secondary | ICD-10-CM | POA: Diagnosis not present

## 2015-05-02 DIAGNOSIS — E1122 Type 2 diabetes mellitus with diabetic chronic kidney disease: Secondary | ICD-10-CM | POA: Diagnosis not present

## 2015-05-02 DIAGNOSIS — Z7984 Long term (current) use of oral hypoglycemic drugs: Secondary | ICD-10-CM | POA: Diagnosis not present

## 2015-08-01 DIAGNOSIS — I69354 Hemiplegia and hemiparesis following cerebral infarction affecting left non-dominant side: Secondary | ICD-10-CM | POA: Diagnosis not present

## 2015-08-01 DIAGNOSIS — I2510 Atherosclerotic heart disease of native coronary artery without angina pectoris: Secondary | ICD-10-CM | POA: Diagnosis not present

## 2015-08-01 DIAGNOSIS — E782 Mixed hyperlipidemia: Secondary | ICD-10-CM | POA: Diagnosis not present

## 2015-08-01 DIAGNOSIS — I129 Hypertensive chronic kidney disease with stage 1 through stage 4 chronic kidney disease, or unspecified chronic kidney disease: Secondary | ICD-10-CM | POA: Diagnosis not present

## 2015-08-01 DIAGNOSIS — Z7984 Long term (current) use of oral hypoglycemic drugs: Secondary | ICD-10-CM | POA: Diagnosis not present

## 2015-08-01 DIAGNOSIS — I700 Atherosclerosis of aorta: Secondary | ICD-10-CM | POA: Diagnosis not present

## 2015-08-01 DIAGNOSIS — N183 Chronic kidney disease, stage 3 (moderate): Secondary | ICD-10-CM | POA: Diagnosis not present

## 2015-08-01 DIAGNOSIS — I779 Disorder of arteries and arterioles, unspecified: Secondary | ICD-10-CM | POA: Diagnosis not present

## 2015-08-01 DIAGNOSIS — E1122 Type 2 diabetes mellitus with diabetic chronic kidney disease: Secondary | ICD-10-CM | POA: Diagnosis not present

## 2015-11-18 DIAGNOSIS — Z23 Encounter for immunization: Secondary | ICD-10-CM | POA: Diagnosis not present

## 2016-01-16 DIAGNOSIS — N183 Chronic kidney disease, stage 3 (moderate): Secondary | ICD-10-CM | POA: Diagnosis not present

## 2016-01-16 DIAGNOSIS — I779 Disorder of arteries and arterioles, unspecified: Secondary | ICD-10-CM | POA: Diagnosis not present

## 2016-01-16 DIAGNOSIS — I2510 Atherosclerotic heart disease of native coronary artery without angina pectoris: Secondary | ICD-10-CM | POA: Diagnosis not present

## 2016-01-16 DIAGNOSIS — E782 Mixed hyperlipidemia: Secondary | ICD-10-CM | POA: Diagnosis not present

## 2016-01-16 DIAGNOSIS — E1122 Type 2 diabetes mellitus with diabetic chronic kidney disease: Secondary | ICD-10-CM | POA: Diagnosis not present

## 2016-01-16 DIAGNOSIS — I69354 Hemiplegia and hemiparesis following cerebral infarction affecting left non-dominant side: Secondary | ICD-10-CM | POA: Diagnosis not present

## 2016-01-16 DIAGNOSIS — I700 Atherosclerosis of aorta: Secondary | ICD-10-CM | POA: Diagnosis not present

## 2016-01-16 DIAGNOSIS — I129 Hypertensive chronic kidney disease with stage 1 through stage 4 chronic kidney disease, or unspecified chronic kidney disease: Secondary | ICD-10-CM | POA: Diagnosis not present

## 2016-01-19 DIAGNOSIS — Z8546 Personal history of malignant neoplasm of prostate: Secondary | ICD-10-CM | POA: Diagnosis not present

## 2016-01-26 DIAGNOSIS — Z8546 Personal history of malignant neoplasm of prostate: Secondary | ICD-10-CM | POA: Diagnosis not present

## 2016-07-16 DIAGNOSIS — I129 Hypertensive chronic kidney disease with stage 1 through stage 4 chronic kidney disease, or unspecified chronic kidney disease: Secondary | ICD-10-CM | POA: Diagnosis not present

## 2016-07-16 DIAGNOSIS — I779 Disorder of arteries and arterioles, unspecified: Secondary | ICD-10-CM | POA: Diagnosis not present

## 2016-07-16 DIAGNOSIS — I700 Atherosclerosis of aorta: Secondary | ICD-10-CM | POA: Diagnosis not present

## 2016-07-16 DIAGNOSIS — E782 Mixed hyperlipidemia: Secondary | ICD-10-CM | POA: Diagnosis not present

## 2016-07-16 DIAGNOSIS — I10 Essential (primary) hypertension: Secondary | ICD-10-CM | POA: Diagnosis not present

## 2016-07-16 DIAGNOSIS — Z0000 Encounter for general adult medical examination without abnormal findings: Secondary | ICD-10-CM | POA: Diagnosis not present

## 2016-07-16 DIAGNOSIS — E1122 Type 2 diabetes mellitus with diabetic chronic kidney disease: Secondary | ICD-10-CM | POA: Diagnosis not present

## 2016-07-16 DIAGNOSIS — I2510 Atherosclerotic heart disease of native coronary artery without angina pectoris: Secondary | ICD-10-CM | POA: Diagnosis not present

## 2016-07-16 DIAGNOSIS — I69354 Hemiplegia and hemiparesis following cerebral infarction affecting left non-dominant side: Secondary | ICD-10-CM | POA: Diagnosis not present

## 2016-11-23 DIAGNOSIS — Z23 Encounter for immunization: Secondary | ICD-10-CM | POA: Diagnosis not present

## 2017-01-14 DIAGNOSIS — Z96652 Presence of left artificial knee joint: Secondary | ICD-10-CM | POA: Diagnosis not present

## 2017-01-14 DIAGNOSIS — I700 Atherosclerosis of aorta: Secondary | ICD-10-CM | POA: Diagnosis not present

## 2017-01-14 DIAGNOSIS — I10 Essential (primary) hypertension: Secondary | ICD-10-CM | POA: Diagnosis not present

## 2017-01-14 DIAGNOSIS — E1122 Type 2 diabetes mellitus with diabetic chronic kidney disease: Secondary | ICD-10-CM | POA: Diagnosis not present

## 2017-01-14 DIAGNOSIS — E782 Mixed hyperlipidemia: Secondary | ICD-10-CM | POA: Diagnosis not present

## 2017-01-21 DIAGNOSIS — Z8546 Personal history of malignant neoplasm of prostate: Secondary | ICD-10-CM | POA: Diagnosis not present

## 2017-01-27 DIAGNOSIS — D49511 Neoplasm of unspecified behavior of right kidney: Secondary | ICD-10-CM | POA: Diagnosis not present

## 2017-01-27 DIAGNOSIS — Z8546 Personal history of malignant neoplasm of prostate: Secondary | ICD-10-CM | POA: Diagnosis not present

## 2017-07-18 DIAGNOSIS — N183 Chronic kidney disease, stage 3 (moderate): Secondary | ICD-10-CM | POA: Diagnosis not present

## 2017-07-18 DIAGNOSIS — I10 Essential (primary) hypertension: Secondary | ICD-10-CM | POA: Diagnosis not present

## 2017-07-18 DIAGNOSIS — E1122 Type 2 diabetes mellitus with diabetic chronic kidney disease: Secondary | ICD-10-CM | POA: Diagnosis not present

## 2017-07-18 DIAGNOSIS — I2510 Atherosclerotic heart disease of native coronary artery without angina pectoris: Secondary | ICD-10-CM | POA: Diagnosis not present

## 2017-07-18 DIAGNOSIS — I700 Atherosclerosis of aorta: Secondary | ICD-10-CM | POA: Diagnosis not present

## 2017-07-18 DIAGNOSIS — I779 Disorder of arteries and arterioles, unspecified: Secondary | ICD-10-CM | POA: Diagnosis not present

## 2017-07-18 DIAGNOSIS — Z0000 Encounter for general adult medical examination without abnormal findings: Secondary | ICD-10-CM | POA: Diagnosis not present

## 2017-07-18 DIAGNOSIS — M1711 Unilateral primary osteoarthritis, right knee: Secondary | ICD-10-CM | POA: Diagnosis not present

## 2017-07-18 DIAGNOSIS — E782 Mixed hyperlipidemia: Secondary | ICD-10-CM | POA: Diagnosis not present

## 2017-08-04 DIAGNOSIS — H401131 Primary open-angle glaucoma, bilateral, mild stage: Secondary | ICD-10-CM | POA: Diagnosis not present

## 2017-08-04 DIAGNOSIS — H2513 Age-related nuclear cataract, bilateral: Secondary | ICD-10-CM | POA: Diagnosis not present

## 2017-08-10 DIAGNOSIS — H2511 Age-related nuclear cataract, right eye: Secondary | ICD-10-CM | POA: Diagnosis not present

## 2017-08-10 DIAGNOSIS — H25811 Combined forms of age-related cataract, right eye: Secondary | ICD-10-CM | POA: Diagnosis not present

## 2017-08-10 DIAGNOSIS — H401111 Primary open-angle glaucoma, right eye, mild stage: Secondary | ICD-10-CM | POA: Diagnosis not present

## 2017-09-05 DIAGNOSIS — H2512 Age-related nuclear cataract, left eye: Secondary | ICD-10-CM | POA: Diagnosis not present

## 2017-09-05 DIAGNOSIS — H401121 Primary open-angle glaucoma, left eye, mild stage: Secondary | ICD-10-CM | POA: Diagnosis not present

## 2017-09-05 DIAGNOSIS — H25812 Combined forms of age-related cataract, left eye: Secondary | ICD-10-CM | POA: Diagnosis not present

## 2017-09-05 DIAGNOSIS — H409 Unspecified glaucoma: Secondary | ICD-10-CM | POA: Diagnosis not present

## 2017-11-15 DIAGNOSIS — Z23 Encounter for immunization: Secondary | ICD-10-CM | POA: Diagnosis not present

## 2018-01-18 DIAGNOSIS — Z8546 Personal history of malignant neoplasm of prostate: Secondary | ICD-10-CM | POA: Diagnosis not present

## 2018-01-20 DIAGNOSIS — N183 Chronic kidney disease, stage 3 (moderate): Secondary | ICD-10-CM | POA: Diagnosis not present

## 2018-01-20 DIAGNOSIS — E782 Mixed hyperlipidemia: Secondary | ICD-10-CM | POA: Diagnosis not present

## 2018-01-20 DIAGNOSIS — I10 Essential (primary) hypertension: Secondary | ICD-10-CM | POA: Diagnosis not present

## 2018-01-20 DIAGNOSIS — M1711 Unilateral primary osteoarthritis, right knee: Secondary | ICD-10-CM | POA: Diagnosis not present

## 2018-01-20 DIAGNOSIS — I2510 Atherosclerotic heart disease of native coronary artery without angina pectoris: Secondary | ICD-10-CM | POA: Diagnosis not present

## 2018-01-20 DIAGNOSIS — E1122 Type 2 diabetes mellitus with diabetic chronic kidney disease: Secondary | ICD-10-CM | POA: Diagnosis not present

## 2018-01-20 DIAGNOSIS — I700 Atherosclerosis of aorta: Secondary | ICD-10-CM | POA: Diagnosis not present

## 2018-01-20 DIAGNOSIS — I779 Disorder of arteries and arterioles, unspecified: Secondary | ICD-10-CM | POA: Diagnosis not present

## 2018-01-20 DIAGNOSIS — Z1211 Encounter for screening for malignant neoplasm of colon: Secondary | ICD-10-CM | POA: Diagnosis not present

## 2018-01-24 DIAGNOSIS — Z8546 Personal history of malignant neoplasm of prostate: Secondary | ICD-10-CM | POA: Diagnosis not present

## 2018-04-03 DIAGNOSIS — H401131 Primary open-angle glaucoma, bilateral, mild stage: Secondary | ICD-10-CM | POA: Diagnosis not present

## 2018-07-24 DIAGNOSIS — I10 Essential (primary) hypertension: Secondary | ICD-10-CM | POA: Diagnosis not present

## 2018-07-24 DIAGNOSIS — E1122 Type 2 diabetes mellitus with diabetic chronic kidney disease: Secondary | ICD-10-CM | POA: Diagnosis not present

## 2018-07-24 DIAGNOSIS — N183 Chronic kidney disease, stage 3 (moderate): Secondary | ICD-10-CM | POA: Diagnosis not present

## 2018-07-24 DIAGNOSIS — I2510 Atherosclerotic heart disease of native coronary artery without angina pectoris: Secondary | ICD-10-CM | POA: Diagnosis not present

## 2018-07-24 DIAGNOSIS — I700 Atherosclerosis of aorta: Secondary | ICD-10-CM | POA: Diagnosis not present

## 2018-07-24 DIAGNOSIS — E782 Mixed hyperlipidemia: Secondary | ICD-10-CM | POA: Diagnosis not present

## 2018-07-24 DIAGNOSIS — Z1211 Encounter for screening for malignant neoplasm of colon: Secondary | ICD-10-CM | POA: Diagnosis not present

## 2018-07-24 DIAGNOSIS — I779 Disorder of arteries and arterioles, unspecified: Secondary | ICD-10-CM | POA: Diagnosis not present

## 2018-11-01 DIAGNOSIS — H401122 Primary open-angle glaucoma, left eye, moderate stage: Secondary | ICD-10-CM | POA: Diagnosis not present

## 2018-11-23 DIAGNOSIS — Z23 Encounter for immunization: Secondary | ICD-10-CM | POA: Diagnosis not present

## 2018-12-13 DIAGNOSIS — H401122 Primary open-angle glaucoma, left eye, moderate stage: Secondary | ICD-10-CM | POA: Diagnosis not present

## 2019-01-04 DIAGNOSIS — H401122 Primary open-angle glaucoma, left eye, moderate stage: Secondary | ICD-10-CM | POA: Diagnosis not present

## 2019-01-19 DIAGNOSIS — Z8546 Personal history of malignant neoplasm of prostate: Secondary | ICD-10-CM | POA: Diagnosis not present

## 2019-01-22 DIAGNOSIS — I700 Atherosclerosis of aorta: Secondary | ICD-10-CM | POA: Diagnosis not present

## 2019-01-22 DIAGNOSIS — Z7984 Long term (current) use of oral hypoglycemic drugs: Secondary | ICD-10-CM | POA: Diagnosis not present

## 2019-01-22 DIAGNOSIS — I69354 Hemiplegia and hemiparesis following cerebral infarction affecting left non-dominant side: Secondary | ICD-10-CM | POA: Diagnosis not present

## 2019-01-22 DIAGNOSIS — Z1211 Encounter for screening for malignant neoplasm of colon: Secondary | ICD-10-CM | POA: Diagnosis not present

## 2019-01-22 DIAGNOSIS — Z0000 Encounter for general adult medical examination without abnormal findings: Secondary | ICD-10-CM | POA: Diagnosis not present

## 2019-01-22 DIAGNOSIS — N183 Chronic kidney disease, stage 3 unspecified: Secondary | ICD-10-CM | POA: Diagnosis not present

## 2019-01-22 DIAGNOSIS — I129 Hypertensive chronic kidney disease with stage 1 through stage 4 chronic kidney disease, or unspecified chronic kidney disease: Secondary | ICD-10-CM | POA: Diagnosis not present

## 2019-01-22 DIAGNOSIS — M1711 Unilateral primary osteoarthritis, right knee: Secondary | ICD-10-CM | POA: Diagnosis not present

## 2019-01-22 DIAGNOSIS — I2510 Atherosclerotic heart disease of native coronary artery without angina pectoris: Secondary | ICD-10-CM | POA: Diagnosis not present

## 2019-01-22 DIAGNOSIS — I779 Disorder of arteries and arterioles, unspecified: Secondary | ICD-10-CM | POA: Diagnosis not present

## 2019-01-22 DIAGNOSIS — E782 Mixed hyperlipidemia: Secondary | ICD-10-CM | POA: Diagnosis not present

## 2019-01-22 DIAGNOSIS — I10 Essential (primary) hypertension: Secondary | ICD-10-CM | POA: Diagnosis not present

## 2019-01-22 DIAGNOSIS — E1122 Type 2 diabetes mellitus with diabetic chronic kidney disease: Secondary | ICD-10-CM | POA: Diagnosis not present

## 2019-01-29 DIAGNOSIS — Z8546 Personal history of malignant neoplasm of prostate: Secondary | ICD-10-CM | POA: Diagnosis not present

## 2019-01-29 DIAGNOSIS — Z85528 Personal history of other malignant neoplasm of kidney: Secondary | ICD-10-CM | POA: Diagnosis not present

## 2019-05-03 DIAGNOSIS — H401122 Primary open-angle glaucoma, left eye, moderate stage: Secondary | ICD-10-CM | POA: Diagnosis not present

## 2019-05-03 DIAGNOSIS — H401111 Primary open-angle glaucoma, right eye, mild stage: Secondary | ICD-10-CM | POA: Diagnosis not present

## 2019-07-18 DIAGNOSIS — I129 Hypertensive chronic kidney disease with stage 1 through stage 4 chronic kidney disease, or unspecified chronic kidney disease: Secondary | ICD-10-CM | POA: Diagnosis not present

## 2019-07-18 DIAGNOSIS — I2510 Atherosclerotic heart disease of native coronary artery without angina pectoris: Secondary | ICD-10-CM | POA: Diagnosis not present

## 2019-07-18 DIAGNOSIS — I779 Disorder of arteries and arterioles, unspecified: Secondary | ICD-10-CM | POA: Diagnosis not present

## 2019-07-18 DIAGNOSIS — M1711 Unilateral primary osteoarthritis, right knee: Secondary | ICD-10-CM | POA: Diagnosis not present

## 2019-07-18 DIAGNOSIS — I700 Atherosclerosis of aorta: Secondary | ICD-10-CM | POA: Diagnosis not present

## 2019-07-18 DIAGNOSIS — E782 Mixed hyperlipidemia: Secondary | ICD-10-CM | POA: Diagnosis not present

## 2019-07-18 DIAGNOSIS — E1122 Type 2 diabetes mellitus with diabetic chronic kidney disease: Secondary | ICD-10-CM | POA: Diagnosis not present

## 2019-07-18 DIAGNOSIS — I10 Essential (primary) hypertension: Secondary | ICD-10-CM | POA: Diagnosis not present

## 2019-07-18 DIAGNOSIS — I69354 Hemiplegia and hemiparesis following cerebral infarction affecting left non-dominant side: Secondary | ICD-10-CM | POA: Diagnosis not present

## 2019-10-16 DIAGNOSIS — I129 Hypertensive chronic kidney disease with stage 1 through stage 4 chronic kidney disease, or unspecified chronic kidney disease: Secondary | ICD-10-CM | POA: Diagnosis not present

## 2019-10-16 DIAGNOSIS — M1711 Unilateral primary osteoarthritis, right knee: Secondary | ICD-10-CM | POA: Diagnosis not present

## 2019-10-16 DIAGNOSIS — E1122 Type 2 diabetes mellitus with diabetic chronic kidney disease: Secondary | ICD-10-CM | POA: Diagnosis not present

## 2019-10-16 DIAGNOSIS — Z8546 Personal history of malignant neoplasm of prostate: Secondary | ICD-10-CM | POA: Diagnosis not present

## 2019-10-16 DIAGNOSIS — Z7984 Long term (current) use of oral hypoglycemic drugs: Secondary | ICD-10-CM | POA: Diagnosis not present

## 2019-10-16 DIAGNOSIS — E782 Mixed hyperlipidemia: Secondary | ICD-10-CM | POA: Diagnosis not present

## 2019-10-16 DIAGNOSIS — I2510 Atherosclerotic heart disease of native coronary artery without angina pectoris: Secondary | ICD-10-CM | POA: Diagnosis not present

## 2019-10-16 DIAGNOSIS — I10 Essential (primary) hypertension: Secondary | ICD-10-CM | POA: Diagnosis not present

## 2019-10-16 DIAGNOSIS — N183 Chronic kidney disease, stage 3 unspecified: Secondary | ICD-10-CM | POA: Diagnosis not present

## 2019-11-08 DIAGNOSIS — H401122 Primary open-angle glaucoma, left eye, moderate stage: Secondary | ICD-10-CM | POA: Diagnosis not present

## 2019-12-13 DIAGNOSIS — I129 Hypertensive chronic kidney disease with stage 1 through stage 4 chronic kidney disease, or unspecified chronic kidney disease: Secondary | ICD-10-CM | POA: Diagnosis not present

## 2019-12-13 DIAGNOSIS — I2510 Atherosclerotic heart disease of native coronary artery without angina pectoris: Secondary | ICD-10-CM | POA: Diagnosis not present

## 2019-12-13 DIAGNOSIS — E1122 Type 2 diabetes mellitus with diabetic chronic kidney disease: Secondary | ICD-10-CM | POA: Diagnosis not present

## 2019-12-13 DIAGNOSIS — N183 Chronic kidney disease, stage 3 unspecified: Secondary | ICD-10-CM | POA: Diagnosis not present

## 2019-12-13 DIAGNOSIS — E782 Mixed hyperlipidemia: Secondary | ICD-10-CM | POA: Diagnosis not present

## 2019-12-13 DIAGNOSIS — I10 Essential (primary) hypertension: Secondary | ICD-10-CM | POA: Diagnosis not present

## 2019-12-13 DIAGNOSIS — M1711 Unilateral primary osteoarthritis, right knee: Secondary | ICD-10-CM | POA: Diagnosis not present

## 2020-01-15 DIAGNOSIS — I2510 Atherosclerotic heart disease of native coronary artery without angina pectoris: Secondary | ICD-10-CM | POA: Diagnosis not present

## 2020-01-15 DIAGNOSIS — M8580 Other specified disorders of bone density and structure, unspecified site: Secondary | ICD-10-CM | POA: Diagnosis not present

## 2020-01-15 DIAGNOSIS — I129 Hypertensive chronic kidney disease with stage 1 through stage 4 chronic kidney disease, or unspecified chronic kidney disease: Secondary | ICD-10-CM | POA: Diagnosis not present

## 2020-01-15 DIAGNOSIS — I10 Essential (primary) hypertension: Secondary | ICD-10-CM | POA: Diagnosis not present

## 2020-01-15 DIAGNOSIS — M1711 Unilateral primary osteoarthritis, right knee: Secondary | ICD-10-CM | POA: Diagnosis not present

## 2020-01-15 DIAGNOSIS — N183 Chronic kidney disease, stage 3 unspecified: Secondary | ICD-10-CM | POA: Diagnosis not present

## 2020-01-15 DIAGNOSIS — Z8546 Personal history of malignant neoplasm of prostate: Secondary | ICD-10-CM | POA: Diagnosis not present

## 2020-01-15 DIAGNOSIS — E1122 Type 2 diabetes mellitus with diabetic chronic kidney disease: Secondary | ICD-10-CM | POA: Diagnosis not present

## 2020-01-15 DIAGNOSIS — E782 Mixed hyperlipidemia: Secondary | ICD-10-CM | POA: Diagnosis not present

## 2020-01-21 DIAGNOSIS — I10 Essential (primary) hypertension: Secondary | ICD-10-CM | POA: Diagnosis not present

## 2020-01-21 DIAGNOSIS — E1122 Type 2 diabetes mellitus with diabetic chronic kidney disease: Secondary | ICD-10-CM | POA: Diagnosis not present

## 2020-01-21 DIAGNOSIS — M1711 Unilateral primary osteoarthritis, right knee: Secondary | ICD-10-CM | POA: Diagnosis not present

## 2020-01-21 DIAGNOSIS — Z1211 Encounter for screening for malignant neoplasm of colon: Secondary | ICD-10-CM | POA: Diagnosis not present

## 2020-01-21 DIAGNOSIS — E782 Mixed hyperlipidemia: Secondary | ICD-10-CM | POA: Diagnosis not present

## 2020-01-21 DIAGNOSIS — I6930 Unspecified sequelae of cerebral infarction: Secondary | ICD-10-CM | POA: Diagnosis not present

## 2020-01-21 DIAGNOSIS — N183 Chronic kidney disease, stage 3 unspecified: Secondary | ICD-10-CM | POA: Diagnosis not present

## 2020-01-21 DIAGNOSIS — Z0000 Encounter for general adult medical examination without abnormal findings: Secondary | ICD-10-CM | POA: Diagnosis not present

## 2020-01-21 DIAGNOSIS — I129 Hypertensive chronic kidney disease with stage 1 through stage 4 chronic kidney disease, or unspecified chronic kidney disease: Secondary | ICD-10-CM | POA: Diagnosis not present

## 2020-01-21 DIAGNOSIS — Z7984 Long term (current) use of oral hypoglycemic drugs: Secondary | ICD-10-CM | POA: Diagnosis not present

## 2020-01-21 DIAGNOSIS — I2510 Atherosclerotic heart disease of native coronary artery without angina pectoris: Secondary | ICD-10-CM | POA: Diagnosis not present

## 2020-01-21 DIAGNOSIS — Z8546 Personal history of malignant neoplasm of prostate: Secondary | ICD-10-CM | POA: Diagnosis not present

## 2020-03-17 DIAGNOSIS — Z8546 Personal history of malignant neoplasm of prostate: Secondary | ICD-10-CM | POA: Diagnosis not present

## 2020-03-17 DIAGNOSIS — M8580 Other specified disorders of bone density and structure, unspecified site: Secondary | ICD-10-CM | POA: Diagnosis not present

## 2020-03-17 DIAGNOSIS — E1122 Type 2 diabetes mellitus with diabetic chronic kidney disease: Secondary | ICD-10-CM | POA: Diagnosis not present

## 2020-03-17 DIAGNOSIS — I2510 Atherosclerotic heart disease of native coronary artery without angina pectoris: Secondary | ICD-10-CM | POA: Diagnosis not present

## 2020-03-17 DIAGNOSIS — N183 Chronic kidney disease, stage 3 unspecified: Secondary | ICD-10-CM | POA: Diagnosis not present

## 2020-03-17 DIAGNOSIS — M1711 Unilateral primary osteoarthritis, right knee: Secondary | ICD-10-CM | POA: Diagnosis not present

## 2020-03-17 DIAGNOSIS — I129 Hypertensive chronic kidney disease with stage 1 through stage 4 chronic kidney disease, or unspecified chronic kidney disease: Secondary | ICD-10-CM | POA: Diagnosis not present

## 2020-03-17 DIAGNOSIS — E782 Mixed hyperlipidemia: Secondary | ICD-10-CM | POA: Diagnosis not present

## 2020-03-17 DIAGNOSIS — I10 Essential (primary) hypertension: Secondary | ICD-10-CM | POA: Diagnosis not present

## 2020-06-23 DIAGNOSIS — I10 Essential (primary) hypertension: Secondary | ICD-10-CM | POA: Diagnosis not present

## 2020-06-23 DIAGNOSIS — Z7984 Long term (current) use of oral hypoglycemic drugs: Secondary | ICD-10-CM | POA: Diagnosis not present

## 2020-06-23 DIAGNOSIS — I2510 Atherosclerotic heart disease of native coronary artery without angina pectoris: Secondary | ICD-10-CM | POA: Diagnosis not present

## 2020-06-23 DIAGNOSIS — I69354 Hemiplegia and hemiparesis following cerebral infarction affecting left non-dominant side: Secondary | ICD-10-CM | POA: Diagnosis not present

## 2020-06-23 DIAGNOSIS — I700 Atherosclerosis of aorta: Secondary | ICD-10-CM | POA: Diagnosis not present

## 2020-06-23 DIAGNOSIS — E1122 Type 2 diabetes mellitus with diabetic chronic kidney disease: Secondary | ICD-10-CM | POA: Diagnosis not present

## 2020-06-23 DIAGNOSIS — I779 Disorder of arteries and arterioles, unspecified: Secondary | ICD-10-CM | POA: Diagnosis not present

## 2020-06-23 DIAGNOSIS — N183 Chronic kidney disease, stage 3 unspecified: Secondary | ICD-10-CM | POA: Diagnosis not present

## 2020-06-23 DIAGNOSIS — E782 Mixed hyperlipidemia: Secondary | ICD-10-CM | POA: Diagnosis not present

## 2020-08-20 DIAGNOSIS — H401122 Primary open-angle glaucoma, left eye, moderate stage: Secondary | ICD-10-CM | POA: Diagnosis not present

## 2020-08-20 DIAGNOSIS — H401111 Primary open-angle glaucoma, right eye, mild stage: Secondary | ICD-10-CM | POA: Diagnosis not present

## 2020-09-29 DIAGNOSIS — Z7984 Long term (current) use of oral hypoglycemic drugs: Secondary | ICD-10-CM | POA: Diagnosis not present

## 2020-09-29 DIAGNOSIS — E1122 Type 2 diabetes mellitus with diabetic chronic kidney disease: Secondary | ICD-10-CM | POA: Diagnosis not present

## 2020-12-11 DIAGNOSIS — H02135 Senile ectropion of left lower eyelid: Secondary | ICD-10-CM | POA: Diagnosis not present

## 2020-12-11 DIAGNOSIS — L309 Dermatitis, unspecified: Secondary | ICD-10-CM | POA: Diagnosis not present

## 2020-12-11 DIAGNOSIS — H02132 Senile ectropion of right lower eyelid: Secondary | ICD-10-CM | POA: Diagnosis not present

## 2021-01-13 DIAGNOSIS — H401122 Primary open-angle glaucoma, left eye, moderate stage: Secondary | ICD-10-CM | POA: Diagnosis not present

## 2021-01-22 DIAGNOSIS — Z7984 Long term (current) use of oral hypoglycemic drugs: Secondary | ICD-10-CM | POA: Diagnosis not present

## 2021-01-22 DIAGNOSIS — E782 Mixed hyperlipidemia: Secondary | ICD-10-CM | POA: Diagnosis not present

## 2021-01-22 DIAGNOSIS — I10 Essential (primary) hypertension: Secondary | ICD-10-CM | POA: Diagnosis not present

## 2021-01-22 DIAGNOSIS — E1122 Type 2 diabetes mellitus with diabetic chronic kidney disease: Secondary | ICD-10-CM | POA: Diagnosis not present

## 2021-01-26 DIAGNOSIS — H401111 Primary open-angle glaucoma, right eye, mild stage: Secondary | ICD-10-CM | POA: Diagnosis not present

## 2021-02-24 DIAGNOSIS — Z961 Presence of intraocular lens: Secondary | ICD-10-CM | POA: Diagnosis not present

## 2021-02-24 DIAGNOSIS — H401111 Primary open-angle glaucoma, right eye, mild stage: Secondary | ICD-10-CM | POA: Diagnosis not present

## 2021-02-24 DIAGNOSIS — H401122 Primary open-angle glaucoma, left eye, moderate stage: Secondary | ICD-10-CM | POA: Diagnosis not present

## 2021-02-24 DIAGNOSIS — H02132 Senile ectropion of right lower eyelid: Secondary | ICD-10-CM | POA: Diagnosis not present

## 2021-03-09 DIAGNOSIS — H401122 Primary open-angle glaucoma, left eye, moderate stage: Secondary | ICD-10-CM | POA: Diagnosis not present

## 2021-03-31 DIAGNOSIS — H02135 Senile ectropion of left lower eyelid: Secondary | ICD-10-CM | POA: Diagnosis not present

## 2021-03-31 DIAGNOSIS — H02132 Senile ectropion of right lower eyelid: Secondary | ICD-10-CM | POA: Diagnosis not present

## 2021-03-31 DIAGNOSIS — H02834 Dermatochalasis of left upper eyelid: Secondary | ICD-10-CM | POA: Diagnosis not present

## 2021-03-31 DIAGNOSIS — H02831 Dermatochalasis of right upper eyelid: Secondary | ICD-10-CM | POA: Diagnosis not present

## 2021-04-10 DIAGNOSIS — H401122 Primary open-angle glaucoma, left eye, moderate stage: Secondary | ICD-10-CM | POA: Diagnosis not present

## 2021-04-10 DIAGNOSIS — H401111 Primary open-angle glaucoma, right eye, mild stage: Secondary | ICD-10-CM | POA: Diagnosis not present

## 2021-06-02 DIAGNOSIS — H401122 Primary open-angle glaucoma, left eye, moderate stage: Secondary | ICD-10-CM | POA: Diagnosis not present

## 2021-06-02 DIAGNOSIS — H401111 Primary open-angle glaucoma, right eye, mild stage: Secondary | ICD-10-CM | POA: Diagnosis not present

## 2021-07-07 DIAGNOSIS — H04203 Unspecified epiphora, bilateral lacrimal glands: Secondary | ICD-10-CM | POA: Diagnosis not present

## 2021-07-07 DIAGNOSIS — H0235 Blepharochalasis left lower eyelid: Secondary | ICD-10-CM | POA: Diagnosis not present

## 2021-07-07 DIAGNOSIS — H04552 Acquired stenosis of left nasolacrimal duct: Secondary | ICD-10-CM | POA: Diagnosis not present

## 2021-07-07 DIAGNOSIS — H02132 Senile ectropion of right lower eyelid: Secondary | ICD-10-CM | POA: Diagnosis not present

## 2021-07-07 DIAGNOSIS — Z01818 Encounter for other preprocedural examination: Secondary | ICD-10-CM | POA: Diagnosis not present

## 2021-07-14 DIAGNOSIS — I700 Atherosclerosis of aorta: Secondary | ICD-10-CM | POA: Diagnosis not present

## 2021-07-14 DIAGNOSIS — Z7984 Long term (current) use of oral hypoglycemic drugs: Secondary | ICD-10-CM | POA: Diagnosis not present

## 2021-07-14 DIAGNOSIS — N1832 Chronic kidney disease, stage 3b: Secondary | ICD-10-CM | POA: Diagnosis not present

## 2021-07-14 DIAGNOSIS — I69354 Hemiplegia and hemiparesis following cerebral infarction affecting left non-dominant side: Secondary | ICD-10-CM | POA: Diagnosis not present

## 2021-07-14 DIAGNOSIS — E1122 Type 2 diabetes mellitus with diabetic chronic kidney disease: Secondary | ICD-10-CM | POA: Diagnosis not present

## 2021-07-14 DIAGNOSIS — I10 Essential (primary) hypertension: Secondary | ICD-10-CM | POA: Diagnosis not present

## 2021-07-14 DIAGNOSIS — I2510 Atherosclerotic heart disease of native coronary artery without angina pectoris: Secondary | ICD-10-CM | POA: Diagnosis not present

## 2021-07-14 DIAGNOSIS — E782 Mixed hyperlipidemia: Secondary | ICD-10-CM | POA: Diagnosis not present

## 2021-07-29 DIAGNOSIS — H02135 Senile ectropion of left lower eyelid: Secondary | ICD-10-CM | POA: Diagnosis not present

## 2021-07-29 DIAGNOSIS — H02105 Unspecified ectropion of left lower eyelid: Secondary | ICD-10-CM | POA: Diagnosis not present

## 2021-07-29 DIAGNOSIS — H02132 Senile ectropion of right lower eyelid: Secondary | ICD-10-CM | POA: Diagnosis not present

## 2021-07-29 DIAGNOSIS — H02102 Unspecified ectropion of right lower eyelid: Secondary | ICD-10-CM | POA: Diagnosis not present

## 2021-12-15 DIAGNOSIS — H401122 Primary open-angle glaucoma, left eye, moderate stage: Secondary | ICD-10-CM | POA: Diagnosis not present

## 2021-12-15 DIAGNOSIS — H401111 Primary open-angle glaucoma, right eye, mild stage: Secondary | ICD-10-CM | POA: Diagnosis not present

## 2022-01-11 DIAGNOSIS — Z23 Encounter for immunization: Secondary | ICD-10-CM | POA: Diagnosis not present

## 2022-01-11 DIAGNOSIS — I700 Atherosclerosis of aorta: Secondary | ICD-10-CM | POA: Diagnosis not present

## 2022-01-11 DIAGNOSIS — I10 Essential (primary) hypertension: Secondary | ICD-10-CM | POA: Diagnosis not present

## 2022-01-11 DIAGNOSIS — I69354 Hemiplegia and hemiparesis following cerebral infarction affecting left non-dominant side: Secondary | ICD-10-CM | POA: Diagnosis not present

## 2022-01-11 DIAGNOSIS — E1122 Type 2 diabetes mellitus with diabetic chronic kidney disease: Secondary | ICD-10-CM | POA: Diagnosis not present

## 2022-01-11 DIAGNOSIS — E782 Mixed hyperlipidemia: Secondary | ICD-10-CM | POA: Diagnosis not present

## 2022-01-11 DIAGNOSIS — N1832 Chronic kidney disease, stage 3b: Secondary | ICD-10-CM | POA: Diagnosis not present

## 2022-01-11 DIAGNOSIS — I2510 Atherosclerotic heart disease of native coronary artery without angina pectoris: Secondary | ICD-10-CM | POA: Diagnosis not present

## 2022-01-15 DIAGNOSIS — E1122 Type 2 diabetes mellitus with diabetic chronic kidney disease: Secondary | ICD-10-CM | POA: Diagnosis not present

## 2022-03-30 DIAGNOSIS — H401122 Primary open-angle glaucoma, left eye, moderate stage: Secondary | ICD-10-CM | POA: Diagnosis not present

## 2022-04-25 ENCOUNTER — Inpatient Hospital Stay (HOSPITAL_COMMUNITY): Payer: Medicare HMO

## 2022-04-25 ENCOUNTER — Inpatient Hospital Stay (HOSPITAL_COMMUNITY)
Admission: EM | Admit: 2022-04-25 | Discharge: 2022-04-30 | DRG: 243 | Disposition: A | Discharge status: Skilled Nursing Facility | Payer: Medicare HMO | Attending: Cardiology | Admitting: Cardiology

## 2022-04-25 ENCOUNTER — Other Ambulatory Visit: Payer: Self-pay

## 2022-04-25 ENCOUNTER — Emergency Department (HOSPITAL_COMMUNITY): Payer: Medicare HMO

## 2022-04-25 ENCOUNTER — Encounter (HOSPITAL_COMMUNITY): Payer: Self-pay | Admitting: Cardiology

## 2022-04-25 ENCOUNTER — Encounter (HOSPITAL_COMMUNITY)
Admission: EM | Disposition: A | Discharge status: Skilled Nursing Facility | Payer: Self-pay | Source: Home / Self Care | Attending: Cardiology

## 2022-04-25 DIAGNOSIS — Z720 Tobacco use: Secondary | ICD-10-CM | POA: Diagnosis not present

## 2022-04-25 DIAGNOSIS — R112 Nausea with vomiting, unspecified: Secondary | ICD-10-CM | POA: Diagnosis present

## 2022-04-25 DIAGNOSIS — Z794 Long term (current) use of insulin: Secondary | ICD-10-CM | POA: Diagnosis not present

## 2022-04-25 DIAGNOSIS — R41841 Cognitive communication deficit: Secondary | ICD-10-CM | POA: Diagnosis not present

## 2022-04-25 DIAGNOSIS — N179 Acute kidney failure, unspecified: Secondary | ICD-10-CM | POA: Diagnosis present

## 2022-04-25 DIAGNOSIS — E876 Hypokalemia: Secondary | ICD-10-CM | POA: Diagnosis present

## 2022-04-25 DIAGNOSIS — Z9889 Other specified postprocedural states: Secondary | ICD-10-CM

## 2022-04-25 DIAGNOSIS — R4182 Altered mental status, unspecified: Secondary | ICD-10-CM | POA: Diagnosis present

## 2022-04-25 DIAGNOSIS — Z823 Family history of stroke: Secondary | ICD-10-CM | POA: Diagnosis not present

## 2022-04-25 DIAGNOSIS — I4430 Unspecified atrioventricular block: Secondary | ICD-10-CM | POA: Diagnosis not present

## 2022-04-25 DIAGNOSIS — I119 Hypertensive heart disease without heart failure: Secondary | ICD-10-CM | POA: Diagnosis present

## 2022-04-25 DIAGNOSIS — Z85528 Personal history of other malignant neoplasm of kidney: Secondary | ICD-10-CM

## 2022-04-25 DIAGNOSIS — R262 Difficulty in walking, not elsewhere classified: Secondary | ICD-10-CM | POA: Diagnosis not present

## 2022-04-25 DIAGNOSIS — Z8673 Personal history of transient ischemic attack (TIA), and cerebral infarction without residual deficits: Secondary | ICD-10-CM

## 2022-04-25 DIAGNOSIS — E782 Mixed hyperlipidemia: Secondary | ICD-10-CM

## 2022-04-25 DIAGNOSIS — D72829 Elevated white blood cell count, unspecified: Secondary | ICD-10-CM | POA: Diagnosis present

## 2022-04-25 DIAGNOSIS — I2510 Atherosclerotic heart disease of native coronary artery without angina pectoris: Secondary | ICD-10-CM | POA: Diagnosis not present

## 2022-04-25 DIAGNOSIS — Z905 Acquired absence of kidney: Secondary | ICD-10-CM | POA: Diagnosis not present

## 2022-04-25 DIAGNOSIS — R739 Hyperglycemia, unspecified: Secondary | ICD-10-CM | POA: Diagnosis not present

## 2022-04-25 DIAGNOSIS — Z951 Presence of aortocoronary bypass graft: Secondary | ICD-10-CM

## 2022-04-25 DIAGNOSIS — I10 Essential (primary) hypertension: Secondary | ICD-10-CM | POA: Diagnosis not present

## 2022-04-25 DIAGNOSIS — I499 Cardiac arrhythmia, unspecified: Secondary | ICD-10-CM | POA: Diagnosis not present

## 2022-04-25 DIAGNOSIS — Z8546 Personal history of malignant neoplasm of prostate: Secondary | ICD-10-CM

## 2022-04-25 DIAGNOSIS — Z7984 Long term (current) use of oral hypoglycemic drugs: Secondary | ICD-10-CM

## 2022-04-25 DIAGNOSIS — E7800 Pure hypercholesterolemia, unspecified: Secondary | ICD-10-CM | POA: Diagnosis present

## 2022-04-25 DIAGNOSIS — R2689 Other abnormalities of gait and mobility: Secondary | ICD-10-CM | POA: Diagnosis not present

## 2022-04-25 DIAGNOSIS — Z888 Allergy status to other drugs, medicaments and biological substances status: Secondary | ICD-10-CM | POA: Diagnosis not present

## 2022-04-25 DIAGNOSIS — E785 Hyperlipidemia, unspecified: Secondary | ICD-10-CM | POA: Diagnosis not present

## 2022-04-25 DIAGNOSIS — R1312 Dysphagia, oropharyngeal phase: Secondary | ICD-10-CM | POA: Diagnosis not present

## 2022-04-25 DIAGNOSIS — Z48812 Encounter for surgical aftercare following surgery on the circulatory system: Secondary | ICD-10-CM | POA: Diagnosis not present

## 2022-04-25 DIAGNOSIS — Z45018 Encounter for adjustment and management of other part of cardiac pacemaker: Secondary | ICD-10-CM

## 2022-04-25 DIAGNOSIS — I6529 Occlusion and stenosis of unspecified carotid artery: Secondary | ICD-10-CM | POA: Diagnosis not present

## 2022-04-25 DIAGNOSIS — E119 Type 2 diabetes mellitus without complications: Secondary | ICD-10-CM | POA: Diagnosis not present

## 2022-04-25 DIAGNOSIS — M6281 Muscle weakness (generalized): Secondary | ICD-10-CM | POA: Diagnosis not present

## 2022-04-25 DIAGNOSIS — R001 Bradycardia, unspecified: Secondary | ICD-10-CM | POA: Diagnosis not present

## 2022-04-25 DIAGNOSIS — N289 Disorder of kidney and ureter, unspecified: Secondary | ICD-10-CM

## 2022-04-25 DIAGNOSIS — Z950 Presence of cardiac pacemaker: Secondary | ICD-10-CM | POA: Diagnosis not present

## 2022-04-25 DIAGNOSIS — I442 Atrioventricular block, complete: Principal | ICD-10-CM

## 2022-04-25 DIAGNOSIS — Z79899 Other long term (current) drug therapy: Secondary | ICD-10-CM | POA: Diagnosis not present

## 2022-04-25 DIAGNOSIS — F17220 Nicotine dependence, chewing tobacco, uncomplicated: Secondary | ICD-10-CM | POA: Diagnosis not present

## 2022-04-25 DIAGNOSIS — R404 Transient alteration of awareness: Secondary | ICD-10-CM | POA: Diagnosis not present

## 2022-04-25 DIAGNOSIS — R569 Unspecified convulsions: Secondary | ICD-10-CM | POA: Diagnosis not present

## 2022-04-25 DIAGNOSIS — I6523 Occlusion and stenosis of bilateral carotid arteries: Secondary | ICD-10-CM

## 2022-04-25 DIAGNOSIS — R7989 Other specified abnormal findings of blood chemistry: Secondary | ICD-10-CM | POA: Diagnosis present

## 2022-04-25 HISTORY — PX: TEMPORARY PACEMAKER: CATH118268

## 2022-04-25 HISTORY — DX: Atrioventricular block, complete: I44.2

## 2022-04-25 LAB — CBC
HCT: 43.5 % (ref 39.0–52.0)
Hemoglobin: 13.9 g/dL (ref 13.0–17.0)
MCH: 31.2 pg (ref 26.0–34.0)
MCHC: 32 g/dL (ref 30.0–36.0)
MCV: 97.8 fL (ref 80.0–100.0)
Platelets: 265 10*3/uL (ref 150–400)
RBC: 4.45 MIL/uL (ref 4.22–5.81)
RDW: 12.7 % (ref 11.5–15.5)
WBC: 19.2 10*3/uL — ABNORMAL HIGH (ref 4.0–10.5)
nRBC: 0 % (ref 0.0–0.2)

## 2022-04-25 LAB — TSH: TSH: 5.554 u[IU]/mL — ABNORMAL HIGH (ref 0.350–4.500)

## 2022-04-25 LAB — BASIC METABOLIC PANEL
Anion gap: 19 — ABNORMAL HIGH (ref 5–15)
BUN: 41 mg/dL — ABNORMAL HIGH (ref 8–23)
CO2: 19 mmol/L — ABNORMAL LOW (ref 22–32)
Calcium: 8.7 mg/dL — ABNORMAL LOW (ref 8.9–10.3)
Chloride: 98 mmol/L (ref 98–111)
Creatinine, Ser: 2.46 mg/dL — ABNORMAL HIGH (ref 0.61–1.24)
GFR, Estimated: 25 mL/min — ABNORMAL LOW (ref >60)
Glucose, Bld: 403 mg/dL — ABNORMAL HIGH (ref 70–99)
Potassium: 3.2 mmol/L — ABNORMAL LOW (ref 3.5–5.1)
Sodium: 136 mmol/L (ref 135–145)

## 2022-04-25 LAB — TROPONIN I (HIGH SENSITIVITY): Troponin I (High Sensitivity): 34 ng/L — ABNORMAL HIGH (ref <18)

## 2022-04-25 LAB — MAGNESIUM: Magnesium: 2 mg/dL (ref 1.7–2.4)

## 2022-04-25 LAB — GLUCOSE, CAPILLARY: Glucose-Capillary: 313 mg/dL — ABNORMAL HIGH (ref 70–99)

## 2022-04-25 SURGERY — TEMPORARY PACEMAKER
Anesthesia: LOCAL

## 2022-04-25 MED ORDER — ORAL CARE MOUTH RINSE: 15.0000 mL | OROMUCOSAL | Status: DC | PRN

## 2022-04-25 MED ORDER — LIDOCAINE HCL (PF) 1 % IJ SOLN
INTRAMUSCULAR | Status: DC | PRN
  Administered 2022-04-25: 12 mL

## 2022-04-25 MED ORDER — FENTANYL CITRATE (PF) 100 MCG/2ML IJ SOLN
INTRAMUSCULAR | Status: AC
  Filled 2022-04-25: qty 2

## 2022-04-25 MED ORDER — INSULIN ASPART 100 UNIT/ML IJ SOLN
0.0000 [IU] | INTRAMUSCULAR | Status: DC
  Administered 2022-04-25: 15 [IU] via SUBCUTANEOUS
  Administered 2022-04-26: 2 [IU] via SUBCUTANEOUS
  Administered 2022-04-27 (×2): 3 [IU] via SUBCUTANEOUS
  Administered 2022-04-27 (×2): 5 [IU] via SUBCUTANEOUS
  Administered 2022-04-27: 2 [IU] via SUBCUTANEOUS
  Administered 2022-04-28: 5 [IU] via SUBCUTANEOUS
  Administered 2022-04-28: 3 [IU] via SUBCUTANEOUS

## 2022-04-25 MED ORDER — POTASSIUM CHLORIDE CRYS ER 20 MEQ PO TBCR: 40.0000 meq | EXTENDED_RELEASE_TABLET | Freq: Once | ORAL | Status: DC

## 2022-04-25 MED ORDER — SIMVASTATIN 20 MG PO TABS
40.0000 mg | ORAL_TABLET | Freq: Every evening | ORAL | Status: DC
  Administered 2022-04-26 – 2022-04-29 (×4): 40 mg via ORAL
  Filled 2022-04-25 (×4): qty 2

## 2022-04-25 MED ORDER — ORAL CARE MOUTH RINSE: 15.0000 mL | OROMUCOSAL | Status: DC

## 2022-04-25 MED ORDER — POTASSIUM CHLORIDE CRYS ER 20 MEQ PO TBCR
20.0000 meq | EXTENDED_RELEASE_TABLET | Freq: Once | ORAL | Status: AC
  Administered 2022-04-25: 20 meq via ORAL
  Filled 2022-04-25: qty 1

## 2022-04-25 MED ORDER — LIDOCAINE HCL (PF) 1 % IJ SOLN
INTRAMUSCULAR | Status: AC
  Filled 2022-04-25: qty 30

## 2022-04-25 MED ORDER — FENTANYL CITRATE (PF) 100 MCG/2ML IJ SOLN
25.0000 ug | Freq: Once | INTRAMUSCULAR | Status: AC
  Administered 2022-04-25: 25 ug via INTRAVENOUS

## 2022-04-25 MED ORDER — POTASSIUM CHLORIDE CRYS ER 20 MEQ PO TBCR
20.0000 meq | EXTENDED_RELEASE_TABLET | Freq: Once | ORAL | Status: DC
  Administered 2022-04-25: 20 meq via ORAL
  Filled 2022-04-25: qty 1

## 2022-04-25 MED ORDER — FENTANYL CITRATE (PF) 100 MCG/2ML IJ SOLN
INTRAMUSCULAR | Status: AC
  Administered 2022-04-25: 25 ug
  Filled 2022-04-25: qty 2

## 2022-04-25 MED ORDER — CHLORHEXIDINE GLUCONATE CLOTH 2 % EX PADS
6.0000 | MEDICATED_PAD | Freq: Every day | CUTANEOUS | Status: DC
  Administered 2022-04-26 – 2022-04-30 (×4): 6 via TOPICAL

## 2022-04-25 MED ORDER — HEPARIN (PORCINE) IN NACL 1000-0.9 UT/500ML-% IV SOLN
INTRAVENOUS | Status: DC | PRN
  Administered 2022-04-25: 500 mL

## 2022-04-25 MED ORDER — SODIUM CHLORIDE 0.9 % IV SOLN
INTRAVENOUS | Status: AC | PRN
  Administered 2022-04-25: 300 mL via INTRAVENOUS
  Administered 2022-04-25: 10 mL/h via INTRAVENOUS
  Administered 2022-04-25: 50 mL/h via INTRAVENOUS

## 2022-04-25 SURGICAL SUPPLY — 7 items
CATH S G BIP PACING (CATHETERS) IMPLANT
KIT MICROPUNCTURE NIT STIFF (SHEATH) IMPLANT
PACK CARDIAC CATHETERIZATION (CUSTOM PROCEDURE TRAY) ×1 IMPLANT
SHEATH PINNACLE 6F 10CM (SHEATH) IMPLANT
SHEATH PROBE COVER 6X72 (BAG) IMPLANT
SLEEVE REPOSITIONING LENGTH 30 (MISCELLANEOUS) IMPLANT
WIRE PACING TEMP ST TIP 5 (CATHETERS) IMPLANT

## 2022-04-25 NOTE — Progress Notes (Signed)
   04/25/22 2045  Spiritual Encounters  Type of Visit Initial  Care provided to: Family  Referral source Code page  Reason for visit Urgent spiritual support   Chaplain responded to a Code STEMI and provided support for family members who were present.  The patient was attended to by the medical team and would be going to the cath lab as soon as the team was ready. I took the family members to the [facility] waiting area.   Danice Goltz [facility] [phone]

## 2022-04-25 NOTE — ED Notes (Signed)
Pt will be taken to cath lab on external pacing on zoll with rn

## 2022-04-25 NOTE — Interval H&P Note (Signed)
History and Physical Interval Note:  04/25/2022 9:17 PM  Scott Coleman  has presented today for surgery, with the diagnosis of STEMI.  The various methods of treatment have been discussed with the patient and family. After consideration of risks, benefits and other options for treatment, the patient has consented to  Procedure(s): TEMPORARY PACEMAKER (N/A) as a surgical intervention.  The patient's history has been reviewed, patient examined, no change in status, stable for surgery.  I have reviewed the patient's chart and labs.  Questions were answered to the patient's satisfaction.     Experiment

## 2022-04-25 NOTE — H&P (Signed)
HISTORY AND PHYSICAL  Patient ID: Scott Coleman MRN: [id number] DOB/AGE: [birth year] 85 y.o.  Admit date: 04/25/2022 Attending physician: Elnora Morrison, MD Primary Physician:  No primary care provider on file.  Chief complaint: Nausea and vomiting  HPI:  Scott Coleman is a 87 y.o. male who presents with a chief complaint of " nausea and vomiting." His past medical history and cardiovascular risk factors include: Hypertension, hyperlipidemia, diabetes mellitus type 2, prostate cancer, history of stroke, right nephrectomy, carotid disease, CAD status post bypass.  Patient lives alone and his daughter usually checks up on him regularly.  Earlier this morning around 11 AM he was in his usual state of health.  However he was not picking up his phone between 6-6:30 PM.  Daughter went to check up on him he was found in a recliner nauseated and vomitus on the floor.  EMS was called and patient was brought to the ED.  Patient was noted to have complete heart block and arrived and external pacer pads were placed and is currently requiring pacing.  Cardiology consulted for further management.  Patient is able to communicate briefly saying yes and knows and answering close ended questions.  He denies chest pain, shortness of breath, lightheaded dizziness, passing out.  Family states that even though he has CAD and history of bypass does not follow-up with cardiology.  ALLERGIES: Allergies  Allergen Reactions   Lipitor [Atorvastatin]     Cramps     PAST MEDICAL HISTORY: Past Medical History:  Diagnosis Date   Coronary artery disease    Diabetes mellitus    H/O: CVA (cerebrovascular accident)    Post right CEA   Hyperlipidemia    Hypertension    Renal cell carcinoma ([place])     PAST SURGICAL HISTORY: Past Surgical History:  Procedure Laterality Date   CORONARY ARTERY BYPASS GRAFT    Right nephrectomy, right carotid endarterectomy  FAMILY HISTORY: The patient family history  includes Stroke in his father.   SOCIAL HISTORY:  Chews tobacco. Denies alcohol or recreational drugs.  MEDICATIONS: Current Outpatient Medications  Medication Instructions   beta carotene w/minerals (OCUVITE) tablet 1 tablet, Daily   glimepiride (AMARYL) 2 mg, Daily before breakfast   insulin glargine (LANTUS) 10 Units, Daily at bedtime   lisinopril (ZESTRIL) 20 mg, Daily   Multiple Vitamin (MULTIVITAMIN) tablet 1 tablet, Daily   Omega-3 Fatty Acids (FISH OIL PO) 2 times daily   simvastatin (ZOCOR) 40 mg, Every evening   REVIEW OF SYSTEMS: Review of Systems  Cardiovascular:  Negative for chest pain, claudication, dyspnea on exertion, irregular heartbeat, leg swelling, near-syncope, orthopnea, palpitations, paroxysmal nocturnal dyspnea and syncope.  Respiratory:  Negative for shortness of breath.   Hematologic/Lymphatic: Negative for bleeding problem.  Musculoskeletal:  Negative for muscle cramps and myalgias.  Gastrointestinal:  Positive for nausea and vomiting.  Neurological:  Negative for dizziness and light-headedness.    PHYSICAL EXAM:    04/25/2022    8:57 PM 04/25/2022    8:45 PM 04/25/2022    8:35 PM  Vitals with BMI  Height   '5\' 7"'$   Weight   186 lbs  BMI   [id number]  Systolic [id number]   Diastolic 79 85   Pulse 78 80     No intake or output data in the 24 hours ending 04/25/22 2101  Net IO Since Admission: No IO data has been entered for this period [04/25/22 2101]  Physical Exam  Constitutional:  Appears older than stated age, chronically ill, mild  distress  Neck: No JVD present.  Right carotid endarterectomy site is clean dry and well-healed  Cardiovascular: S1 normal, S2 normal, intact distal pulses and normal pulses. Exam reveals no gallop, no S3 and no S4.  Externally paced. No murmurs rubs or gallops appreciated due to external pacing  Pulmonary/Chest: Effort normal and breath sounds normal. No stridor. He has no wheezes. He has no rales.  External pacer  pads present and actively pacing.. Sternotomy site is well-healed.  Abdominal: Soft. Bowel sounds are normal. He exhibits no distension. There is no abdominal tenderness.  Musculoskeletal:        General: No edema.     Cervical back: Neck supple.  Neurological: He is alert and oriented to person, place, and time. He has intact cranial nerves (2-12).  Skin: Skin is warm and moist.   RADIOLOGY: DG Chest Portable 1 View  Result Date: 04/25/2022 CLINICAL DATA:  Bradycardia requiring external pacing. Found down by family unresponsive. EXAM: PORTABLE CHEST 1 VIEW COMPARISON:  PA Lat 05/09/2020 FINDINGS: There is mild cardiomegaly, increased from prior study. Central vascular prominence and flow cephalization are noted but there is no overt edema. Increased streaky retrocardiac left lower lobe opacity is noted and could be due to pneumonia or aspiration versus atelectasis. The remaining lungs are clear. There is no substantial pleural effusion. There are old CABG changes with stable mediastinum, mild aortic atherosclerosis. External pacing electrical pads overlie the left upper abdomen. No acute osseous findings. IMPRESSION: 1. Increased retrocardiac left lower lobe opacity which could be due to pneumonia or aspiration versus atelectasis. 2. Mild cardiomegaly and central vascular prominence without overt edema. 3. Aortic atherosclerosis. 4. Old CABG changes. Electronically Signed   By: Telford Nab M.D.   On: 04/25/2022 20:52    LABORATORY DATA: Lab Results  Component Value Date   WBC 19.2 (H) 04/25/2022   HGB 13.9 04/25/2022   HCT 43.5 04/25/2022   MCV 97.8 04/25/2022   PLT 265 04/25/2022   No results for input(s): "NA", "K", "CL", "CO2", "BUN", "CREATININE", "CALCIUM", "PROT", "BILITOT", "ALKPHOS", "ALT", "AST", "GLUCOSE" in the last 168 hours.  Invalid input(s): "LABALBU"  Lipid Panel  No results found for: "CHOL", "HDL", "LDLCALC", "LDLDIRECT", "TRIG", "CHOLHDL"  BNP (last 3 results) No  results for input(s): "BNP" in the last 8760 hours.  HEMOGLOBIN A1C Lab Results  Component Value Date   HGBA1C (H) 04/15/2008    8.6 (NOTE)   The ADA recommends the following therapeutic goal for glycemic   control related to Hgb A1C measurement:   Goal of Therapy:   < 7.0% Hgb A1C   Reference: American Diabetes Association: Clinical Practice   Recommendations 2008, Diabetes Care,  2008, 31:(Suppl 1).   MPG 200 04/15/2008    Cardiac Panel (last 3 results) No results for input(s): "CKTOTAL", "CKMB", "RELINDX" in the last 8760 hours.  Invalid input(s): "TROPONINHS"  Lab Results  Component Value Date   CKTOTAL 65 03/17/2008   CKMB 3.2 03/17/2008     TSH No results for input(s): "TSH" in the last 8760 hours.    CARDIAC DATABASE: No EKG-baseline artifact and decreasing pacing leads to hemodynamic compromise.  EKG rhythm strip provided by EMS notes complete heart block with ventricular escape beats.  Heart Catheterization: Last performed in 2010 by Dr. Katharina Caper.  Records reviewed.  04/17/2008: CABG 5 vessel bypass LIMA to the LAD, SVG to diagonal, SVG sequentially grafted to second and fourth obtuse marginal, SVG to RCA  IMPRESSION & RECOMMENDATIONS: Scott Coleman is  a 87 y.o. Caucasian male whose past medical history and cardiovascular risk factors include: Hypertension, hyperlipidemia, diabetes mellitus type 2, prostate cancer, history of stroke, right nephrectomy, carotid disease, CAD status post bypass.  Impression: Symptomatic complete heart block Establish coronary artery disease history of 5 vessel bypass without angina pectoris Hypertension. Hyperlipidemia. Diabetes mellitus type 2. History of right nephrectomy. History of CVA. Carotid disease status post right carotid endarterectomy. Chewing tobacco  Recommendations: Patient presents via EMS due to nausea and vomiting and telemetry noting complete heart block.  He is not symptomatic and was requiring external  pacing.  Currently at 90 Amp.  Patient denies any active chest pain and no chest pain or shortness of breath days leading to today's events  Spoke to the patient, his daughter and ex-wife.  The procedure of temporary pacemaker wire was explained to the patient, daughter, and ex-wife in detail.  The indication, alternatives, risks and benefits were reviewed.  Complications include but not limited to bleeding, infection, vascular injury, stroke, myocardial infarction, arrhythmia (requiring medical or cardiopulmonary resuscitation), kidney injury (requiring short-term or long-term hemodialysis), radiation-related injury in the case of prolonged fluoroscopy use, emergent cardiac surgery, and death. The patient and daughter understands the risks of serious complication and they wish to proceed forward.   Labs pending.   Spoke to interventional cardiology colleague Dr. Virgina Jock.  Cath Lab activated for temporary pacemaker wire.  Further recommendations to follow.  Plan of care also discussed w/ ER physician and resident.    Code Status: Will discuss w/ patient and family.     Family Communication:  daughter and ex-wife    Disposition Plan: home when medically cleared.    CRITICAL CARE Performed by: Rex Kras   Total critical care time: 42 minutes   Critical care time was exclusive of separately billable procedures and treating other patients.   Critical care was necessary to treat or prevent imminent or life-threatening deterioration.   Critical care was time spent personally by me on the following activities: development of treatment plan with patient and/or surrogate as well as nursing, discussions with consultants, evaluation of patient's response to treatment, examination of patient, obtaining history from patient or surrogate, ordering and performing treatments and interventions, ordering and review of laboratory studies, ordering and review of radiographic studies, pulse  oximetry and re-evaluation of patient's condition.  Mechele Claude [facility]  Pager:  [phone] Office: [phone] 04/25/2022, 9:01 PM  ADDENDUM Admission orders placed.  Spoke to RN who confirms with the patient that he would like to be full code.  Will verify tomorrow morning personally.  Initial labs note leukocytosis which could be reactive given his presentation.  But since he may be considered for pacemaker implant will check procalcitonin, UA, blood cultures, respiratory panel.  No fevers thus far.  If he develops fever and/or procalcitonin is high will consider antibiotics.  Continue telemetry  Electrolytes replaced.  Elevated renal function-no baseline.  Gentle hydration for now.  Hold metformin, ACE inhibitor/ARB and thiazide diuretics.  Rex Kras, [place], [facility]  Pager:  [phone] Office: [phone]

## 2022-04-25 NOTE — Code Documentation (Signed)
Cardiology to bedside. 

## 2022-04-25 NOTE — ED Triage Notes (Signed)
Pt is able to answer questions. Provider speaking with family

## 2022-04-25 NOTE — Code Documentation (Signed)
Unable to obtain EKG d/t pacing

## 2022-04-25 NOTE — ED Notes (Signed)
cardiology Provider at bedside. 

## 2022-04-25 NOTE — ED Triage Notes (Signed)
Pt arrives from home, per report by [facility], the pt has been lethargic today, was found down by family on the side of the recliner. On arrival by EMS, the pt was gray/ashen, vomitus. Appeared to be in a 3rd degree heart block. Pt was having asystolic periods and would have "seizure like activity" during these periods. Pt has been externally paced around 70 rate, 150 amp. 250 ml and epi drip initiated. IV x 2. NRB placed. 100/60 bp just PTA. Given 5 versed for sedation. Pt was talking prior to administration of versed. Cbg 380

## 2022-04-25 NOTE — ED Provider Notes (Signed)
[facility] CATH LAB Provider Note   CSN: [id number] Arrival date & time: 04/25/22  2008     History  Chief Complaint  Patient presents with   Altered Mental Status    Scott Coleman is a 87 y.o. male.  This is a 87 year old male with history of hypertension, hyperlipidemia, type 2 diabetes, stroke, right nephrectomy, carotid disease, CAD status post bypass presenting to the ED for altered mental status.  Patient reportedly feeling fatigued today, had an episode with nausea and vomiting while sitting in his recliner.  EMS was called, patient found to be in complete heart block and was placed on external pacer pads as well as an epi drip.  He is currently alert and oriented on the pacer pads, he denies any chest pain, abdominal pain, shortness of breath, nausea, vomiting.   Altered Mental Status Associated symptoms: light-headedness and weakness   Associated symptoms: no abdominal pain and no fever        Home Medications Prior to Admission medications   Medication Sig Start Date End Date Taking? Authorizing Provider  beta carotene w/minerals (OCUVITE) tablet Take 1 tablet by mouth daily.    [provider]  glimepiride (AMARYL) 2 MG tablet Take 2 mg by mouth daily before breakfast.    [provider]  insulin glargine (LANTUS) 100 UNIT/ML injection Inject 10 Units into the skin at bedtime.    [provider]  lisinopril (PRINIVIL,ZESTRIL) 20 MG tablet Take 20 mg by mouth daily.    [provider]  Multiple Vitamin (MULTIVITAMIN) tablet Take 1 tablet by mouth daily.    [provider]  Omega-3 Fatty Acids (FISH OIL PO) Take by mouth 2 (two) times daily.    [provider]  simvastatin (ZOCOR) 40 MG tablet Take 40 mg by mouth every evening.    [provider]      Allergies    Lipitor [atorvastatin]    Review of Systems   Review of Systems  Constitutional:  Negative for fever.   Respiratory:  Negative for shortness of breath.   Cardiovascular:  Negative for chest pain.  Gastrointestinal:  Negative for abdominal pain.  Genitourinary:  Negative for flank pain.  Neurological:  Positive for weakness and light-headedness.    Physical Exam Updated Vital Signs BP (!) 140/124   Pulse 79   Temp (!) 97.3 F (36.3 C) (Axillary)   Resp (!) 54   Ht '5\' 7"'$  (1.702 m)   Wt 84.4 kg   SpO2 93%   BMI 29.13 kg/m  Physical Exam Vitals and nursing note reviewed.  Constitutional:      Appearance: He is ill-appearing and toxic-appearing.  HENT:     Head: Normocephalic and atraumatic.     Mouth/Throat:     Mouth: Mucous membranes are moist.  Eyes:     Extraocular Movements: Extraocular movements intact.     Pupils: Pupils are equal, round, and reactive to light.  Cardiovascular:     Pulses: Normal pulses.     Comments: External pacers applied, no obvious murmur rubs or gallops Pulmonary:     Effort: Pulmonary effort is normal.  Abdominal:     General: There is no distension.     Palpations: Abdomen is soft.     Tenderness: There is no abdominal tenderness. There is no guarding or rebound.  Skin:    General: Skin is warm.     Capillary Refill: Capillary refill takes less than 2 seconds.  Neurological:  General: No focal deficit present.     Mental Status: He is alert and oriented to person, place, and time.     ED Results / Procedures / Treatments   Labs (all labs ordered are listed, but only abnormal results are displayed) Labs Reviewed  BASIC METABOLIC PANEL - Abnormal; Notable for the following components:      Result Value   Potassium 3.2 (*)    CO2 19 (*)    Glucose, Bld 403 (*)    BUN 41 (*)    Creatinine, Ser 2.46 (*)    Calcium 8.7 (*)    GFR, Estimated 25 (*)    Anion gap 19 (*)    All other components within normal limits  CBC - Abnormal; Notable for the following components:   WBC 19.2 (*)    All other components within normal limits   TROPONIN I (HIGH SENSITIVITY) - Abnormal; Notable for the following components:   Troponin I (High Sensitivity) 34 (*)    All other components within normal limits  MAGNESIUM  TSH    EKG None  Radiology DG Chest Portable 1 View  Result Date: 04/25/2022 CLINICAL DATA:  Bradycardia requiring external pacing. Found down by family unresponsive. EXAM: PORTABLE CHEST 1 VIEW COMPARISON:  PA Lat 05/09/2020 FINDINGS: There is mild cardiomegaly, increased from prior study. Central vascular prominence and flow cephalization are noted but there is no overt edema. Increased streaky retrocardiac left lower lobe opacity is noted and could be due to pneumonia or aspiration versus atelectasis. The remaining lungs are clear. There is no substantial pleural effusion. There are old CABG changes with stable mediastinum, mild aortic atherosclerosis. External pacing electrical pads overlie the left upper abdomen. No acute osseous findings. IMPRESSION: 1. Increased retrocardiac left lower lobe opacity which could be due to pneumonia or aspiration versus atelectasis. 2. Mild cardiomegaly and central vascular prominence without overt edema. 3. Aortic atherosclerosis. 4. Old CABG changes. Electronically Signed   By: Telford Nab M.D.   On: 04/25/2022 20:52    Procedures Procedures    Medications Ordered in ED Medications  fentaNYL (SUBLIMAZE) 100 MCG/2ML injection (has no administration in time range)  Heparin (Porcine) in NaCl 1000-0.9 UT/500ML-% SOLN (500 mLs  Given 04/25/22 2125)  lidocaine (PF) (XYLOCAINE) 1 % injection (12 mLs Infiltration Given 04/25/22 2128)  0.9 %  sodium chloride infusion (500 mLs Intravenous New Bag/Given 04/25/22 2131)  fentaNYL (SUBLIMAZE) 100 MCG/2ML injection (25 mcg  Given 04/25/22 2031)  fentaNYL (SUBLIMAZE) injection 25 mcg (25 mcg Intravenous Given 04/25/22 2105)    ED Course/ Medical Decision Making/ A&P                             Medical Decision Making Patient presents  critically ill, requiring external pacing.  Upon arrival patient is alert and oriented x 3, able to answer questions.  We switched patient over to our pacer equipment.  He was on 150 mA at a pace rate of 80.  While switching him over I titrated down his mA to ED.  I then tried to titrate down to get an EKG however patient became less responsive, had agonal breathing and pale.  We then increased the milliamps to 90 and he regained his color and consciousness.  We immediately called cardiology as patient likely needs pacemaker.  I personally reviewed and interpreted patient's rhythm strip from EMS which shows he is in third-degree heart block.  Basic labs sent  including a CBC, BMP, magnesium and troponin.  I personally reviewed and interpreted patient's labs, AKI with creatinine of 2.46, hypokalemia to 3.2, mag normal at 2.0.  Elevated troponin 34 with a white count of 19.2.  I personally reviewed and interpreted patient's chest x-ray which shows left lower lobe opacity and cardiomegaly.  We discussed this case with cardiology who agreed patient is in third-degree heart block, they came down to evaluate the patient, interventional cardiologist was called and the Cath Lab was activated for temporary pacemaker placement.  Patient taken emergently to the Cath Lab for pacemaker placement.    Problems Addressed: Third degree heart block [facility]): acute illness or injury that poses a threat to life or bodily functions  Amount and/or Complexity of Data Reviewed Labs: ordered. Decision-making details documented in ED Course. Radiology: ordered and independent interpretation performed. Decision-making details documented in ED Course.  Risk Prescription drug management.          Final Clinical Impression(s) / ED Diagnoses Final diagnoses:  None    Rx / DC Orders ED Discharge Orders     None         Jimmie Molly, MD 04/25/22 2140    Elnora Morrison, MD 04/28/22 [phone]

## 2022-04-26 ENCOUNTER — Inpatient Hospital Stay (HOSPITAL_COMMUNITY): Payer: Medicare HMO

## 2022-04-26 ENCOUNTER — Encounter (HOSPITAL_COMMUNITY)
Admission: EM | Disposition: A | Discharge status: Skilled Nursing Facility | Payer: Self-pay | Source: Home / Self Care | Attending: Cardiology

## 2022-04-26 ENCOUNTER — Encounter (HOSPITAL_COMMUNITY): Payer: Self-pay | Admitting: Cardiology

## 2022-04-26 DIAGNOSIS — N289 Disorder of kidney and ureter, unspecified: Secondary | ICD-10-CM

## 2022-04-26 DIAGNOSIS — Z950 Presence of cardiac pacemaker: Secondary | ICD-10-CM

## 2022-04-26 DIAGNOSIS — I442 Atrioventricular block, complete: Principal | ICD-10-CM

## 2022-04-26 DIAGNOSIS — R001 Bradycardia, unspecified: Secondary | ICD-10-CM

## 2022-04-26 DIAGNOSIS — D72829 Elevated white blood cell count, unspecified: Secondary | ICD-10-CM

## 2022-04-26 HISTORY — DX: Presence of cardiac pacemaker: Z95.0

## 2022-04-26 HISTORY — PX: PACEMAKER IMPLANT: EP1218

## 2022-04-26 LAB — BASIC METABOLIC PANEL
Anion gap: 12 (ref 5–15)
BUN: 38 mg/dL — ABNORMAL HIGH (ref 8–23)
CO2: 23 mmol/L (ref 22–32)
Calcium: 9 mg/dL (ref 8.9–10.3)
Chloride: 105 mmol/L (ref 98–111)
Creatinine, Ser: 1.97 mg/dL — ABNORMAL HIGH (ref 0.61–1.24)
GFR, Estimated: 33 mL/min — ABNORMAL LOW (ref >60)
Glucose, Bld: 87 mg/dL (ref 70–99)
Potassium: 4.2 mmol/L (ref 3.5–5.1)
Sodium: 140 mmol/L (ref 135–145)

## 2022-04-26 LAB — HEMOGLOBIN A1C
Hgb A1c MFr Bld: 8.7 % — ABNORMAL HIGH (ref 4.8–5.6)
Hgb A1c MFr Bld: 8.5 % — ABNORMAL HIGH (ref 4.8–5.6)
Mean Plasma Glucose: 203 mg/dL
Mean Plasma Glucose: 197 mg/dL

## 2022-04-26 LAB — MRSA NEXT GEN BY PCR, NASAL: MRSA by PCR Next Gen: NOT DETECTED

## 2022-04-26 LAB — URINALYSIS, ROUTINE W REFLEX MICROSCOPIC
Bacteria, UA: NONE SEEN
Bilirubin Urine: NEGATIVE
Glucose, UA: >=500 mg/dL — AB
Hgb urine dipstick: NEGATIVE
Ketones, ur: NEGATIVE mg/dL
Leukocytes,Ua: NEGATIVE
Nitrite: NEGATIVE
Protein, ur: NEGATIVE mg/dL
Specific Gravity, Urine: 1.015 (ref 1.005–1.030)
pH: 5 (ref 5.0–8.0)

## 2022-04-26 LAB — CBC
HCT: 40.8 % (ref 39.0–52.0)
HCT: 39.6 % (ref 39.0–52.0)
Hemoglobin: 14.3 g/dL (ref 13.0–17.0)
Hemoglobin: 13.5 g/dL (ref 13.0–17.0)
MCH: 32.3 pg (ref 26.0–34.0)
MCH: 31.5 pg (ref 26.0–34.0)
MCHC: 35 g/dL (ref 30.0–36.0)
MCHC: 34.1 g/dL (ref 30.0–36.0)
MCV: 92.1 fL (ref 80.0–100.0)
MCV: 92.3 fL (ref 80.0–100.0)
Platelets: 211 10*3/uL (ref 150–400)
Platelets: 203 10*3/uL (ref 150–400)
RBC: 4.43 MIL/uL (ref 4.22–5.81)
RBC: 4.29 MIL/uL (ref 4.22–5.81)
RDW: 12.7 % (ref 11.5–15.5)
RDW: 12.8 % (ref 11.5–15.5)
WBC: 12.3 10*3/uL — ABNORMAL HIGH (ref 4.0–10.5)
WBC: 10.8 10*3/uL — ABNORMAL HIGH (ref 4.0–10.5)
nRBC: 0 % (ref 0.0–0.2)
nRBC: 0 % (ref 0.0–0.2)

## 2022-04-26 LAB — RESPIRATORY PANEL BY PCR

## 2022-04-26 LAB — ECHOCARDIOGRAM COMPLETE
AR max vel: 1.32 cm2
AV Area VTI: 1.21 cm2
AV Area mean vel: 1.2 cm2
AV Mean grad: 5 mmHg
AV Peak grad: 8 mmHg
Ao pk vel: 1.41 m/s
Area-P 1/2: 3.28 cm2
Height: 67 in
S' Lateral: 3.1 cm
Weight: 2578.5 oz

## 2022-04-26 LAB — SURGICAL PCR SCREEN
MRSA, PCR: NEGATIVE
Staphylococcus aureus: NEGATIVE

## 2022-04-26 LAB — LIPID PANEL
Cholesterol: 112 mg/dL (ref 0–200)
HDL: 33 mg/dL — ABNORMAL LOW (ref >40)
LDL Cholesterol: 54 mg/dL (ref 0–99)
Total CHOL/HDL Ratio: 3.4 RATIO
Triglycerides: 127 mg/dL (ref <150)
VLDL: 25 mg/dL (ref 0–40)

## 2022-04-26 LAB — PROCALCITONIN: Procalcitonin: <0.1 ng/mL

## 2022-04-26 LAB — GLUCOSE, CAPILLARY
Glucose-Capillary: 108 mg/dL — ABNORMAL HIGH (ref 70–99)
Glucose-Capillary: 134 mg/dL — ABNORMAL HIGH (ref 70–99)
Glucose-Capillary: 67 mg/dL — ABNORMAL LOW (ref 70–99)
Glucose-Capillary: 112 mg/dL — ABNORMAL HIGH (ref 70–99)
Glucose-Capillary: 126 mg/dL — ABNORMAL HIGH (ref 70–99)
Glucose-Capillary: 84 mg/dL (ref 70–99)

## 2022-04-26 LAB — TROPONIN I (HIGH SENSITIVITY): Troponin I (High Sensitivity): 88 ng/L — ABNORMAL HIGH (ref <18)

## 2022-04-26 LAB — CREATININE, SERUM
Creatinine, Ser: 2.08 mg/dL — ABNORMAL HIGH (ref 0.61–1.24)
GFR, Estimated: 31 mL/min — ABNORMAL LOW (ref >60)

## 2022-04-26 LAB — MAGNESIUM: Magnesium: 2.1 mg/dL (ref 1.7–2.4)

## 2022-04-26 LAB — T4, FREE: Free T4: 1.37 ng/dL — ABNORMAL HIGH (ref 0.61–1.12)

## 2022-04-26 LAB — LDL CHOLESTEROL, DIRECT: Direct LDL: 55 mg/dL (ref 0–99)

## 2022-04-26 SURGERY — PACEMAKER IMPLANT

## 2022-04-26 MED ORDER — DOCUSATE SODIUM 100 MG PO CAPS: 100.0000 mg | ORAL_CAPSULE | Freq: Two times a day (BID) | ORAL | Status: DC | PRN

## 2022-04-26 MED ORDER — DEXTROSE 50 % IV SOLN
12.5000 g | Freq: Once | INTRAVENOUS | Status: AC
  Administered 2022-04-26: 12.5 g via INTRAVENOUS
  Filled 2022-04-26: qty 50

## 2022-04-26 MED ORDER — LIDOCAINE HCL (PF) 1 % IJ SOLN
INTRAMUSCULAR | Status: DC | PRN
  Administered 2022-04-26: 50 mL

## 2022-04-26 MED ORDER — ACETAMINOPHEN 325 MG PO TABS
325.0000 mg | ORAL_TABLET | ORAL | Status: DC | PRN
  Administered 2022-04-27 – 2022-04-28 (×3): 650 mg via ORAL
  Filled 2022-04-26 (×3): qty 2

## 2022-04-26 MED ORDER — MIDAZOLAM HCL 5 MG/5ML IJ SOLN
INTRAMUSCULAR | Status: AC
  Filled 2022-04-26: qty 5

## 2022-04-26 MED ORDER — VANCOMYCIN HCL IN DEXTROSE 1-5 GM/200ML-% IV SOLN
INTRAVENOUS | Status: AC
  Filled 2022-04-26: qty 200

## 2022-04-26 MED ORDER — LIDOCAINE HCL (PF) 1 % IJ SOLN
INTRAMUSCULAR | Status: AC
  Filled 2022-04-26: qty 60

## 2022-04-26 MED ORDER — SODIUM CHLORIDE 0.9 % IV SOLN: INTRAVENOUS | Status: DC

## 2022-04-26 MED ORDER — LIDOCAINE HCL (PF) 1 % IJ SOLN
INTRAMUSCULAR | Status: AC
  Filled 2022-04-26: qty 30

## 2022-04-26 MED ORDER — FENTANYL CITRATE (PF) 100 MCG/2ML IJ SOLN
INTRAMUSCULAR | Status: DC | PRN
  Administered 2022-04-26 (×3): 12.5 ug via INTRAVENOUS

## 2022-04-26 MED ORDER — CHLORHEXIDINE GLUCONATE 4 % EX LIQD: 60.0000 mL | Freq: Once | CUTANEOUS | Status: AC

## 2022-04-26 MED ORDER — SODIUM CHLORIDE 0.9 % IV SOLN
INTRAVENOUS | Status: AC
  Filled 2022-04-26: qty 2

## 2022-04-26 MED ORDER — INSULIN DETEMIR 100 UNIT/ML ~~LOC~~ SOLN
5.0000 [IU] | Freq: Every day | SUBCUTANEOUS | Status: DC
  Administered 2022-04-26 – 2022-04-29 (×5): 5 [IU] via SUBCUTANEOUS
  Filled 2022-04-26 (×7): qty 0.05

## 2022-04-26 MED ORDER — SODIUM CHLORIDE 0.9 % IV SOLN: INTRAVENOUS | Status: DC | PRN

## 2022-04-26 MED ORDER — FENTANYL CITRATE (PF) 100 MCG/2ML IJ SOLN
INTRAMUSCULAR | Status: AC
  Filled 2022-04-26: qty 2

## 2022-04-26 MED ORDER — CHLORHEXIDINE GLUCONATE 4 % EX LIQD
60.0000 mL | Freq: Once | CUTANEOUS | Status: AC
  Administered 2022-04-26: 4 via TOPICAL

## 2022-04-26 MED ORDER — PANTOPRAZOLE SODIUM 40 MG PO TBEC
40.0000 mg | DELAYED_RELEASE_TABLET | Freq: Every day | ORAL | Status: DC
  Administered 2022-04-26 – 2022-04-30 (×5): 40 mg via ORAL
  Filled 2022-04-26 (×5): qty 1

## 2022-04-26 MED ORDER — POTASSIUM CHLORIDE CRYS ER 20 MEQ PO TBCR
20.0000 meq | EXTENDED_RELEASE_TABLET | Freq: Once | ORAL | Status: AC
  Administered 2022-04-26: 20 meq via ORAL
  Filled 2022-04-26: qty 1

## 2022-04-26 MED ORDER — CEFAZOLIN SODIUM-DEXTROSE 2-4 GM/100ML-% IV SOLN
2.0000 g | INTRAVENOUS | Status: AC
  Administered 2022-04-26: 2 g via INTRAVENOUS
  Filled 2022-04-26: qty 100

## 2022-04-26 MED ORDER — HEPARIN SODIUM (PORCINE) 5000 UNIT/ML IJ SOLN: 5000.0000 [IU] | Freq: Three times a day (TID) | INTRAMUSCULAR | Status: DC

## 2022-04-26 MED ORDER — POLYETHYLENE GLYCOL 3350 17 G PO PACK: 17.0000 g | PACK | Freq: Every day | ORAL | Status: DC | PRN

## 2022-04-26 MED ORDER — LIDOCAINE HCL 1 % IJ SOLN
INTRAMUSCULAR | Status: AC
  Filled 2022-04-26: qty 20

## 2022-04-26 MED ORDER — SODIUM CHLORIDE 0.9 % IV SOLN: INTRAVENOUS | Status: AC

## 2022-04-26 MED ORDER — IOHEXOL 350 MG/ML SOLN
INTRAVENOUS | Status: DC | PRN
  Administered 2022-04-26: 10 mL

## 2022-04-26 MED ORDER — SODIUM CHLORIDE 0.9 % IV SOLN
80.0000 mg | INTRAVENOUS | Status: AC
  Administered 2022-04-26: 80 mg
  Filled 2022-04-26: qty 2

## 2022-04-26 MED ORDER — CEFAZOLIN SODIUM-DEXTROSE 1-4 GM/50ML-% IV SOLN
1.0000 g | Freq: Four times a day (QID) | INTRAVENOUS | Status: AC
  Administered 2022-04-26 – 2022-04-27 (×3): 1 g via INTRAVENOUS
  Filled 2022-04-26 (×4): qty 50

## 2022-04-26 MED ORDER — ONDANSETRON HCL 4 MG/2ML IJ SOLN: 4.0000 mg | Freq: Four times a day (QID) | INTRAMUSCULAR | Status: DC | PRN

## 2022-04-26 MED ORDER — CEFAZOLIN SODIUM-DEXTROSE 2-4 GM/100ML-% IV SOLN
INTRAVENOUS | Status: AC
  Filled 2022-04-26: qty 100

## 2022-04-26 MED ORDER — BUPIVACAINE HCL (PF) 0.25 % IJ SOLN
INTRAMUSCULAR | Status: AC
  Filled 2022-04-26: qty 60

## 2022-04-26 MED ORDER — MIDAZOLAM HCL 5 MG/5ML IJ SOLN
INTRAMUSCULAR | Status: DC | PRN
  Administered 2022-04-26 (×2): 1 mg via INTRAVENOUS

## 2022-04-26 MED ORDER — HEPARIN (PORCINE) IN NACL 1000-0.9 UT/500ML-% IV SOLN
INTRAVENOUS | Status: DC | PRN
  Administered 2022-04-26: 500 mL

## 2022-04-26 MED ORDER — ASPIRIN 81 MG PO TBEC
81.0000 mg | DELAYED_RELEASE_TABLET | Freq: Every day | ORAL | Status: DC
  Administered 2022-04-26 – 2022-04-30 (×5): 81 mg via ORAL
  Filled 2022-04-26 (×5): qty 1

## 2022-04-26 SURGICAL SUPPLY — 14 items
CABLE SURGICAL S-101-97-12 (CABLE) ×1 IMPLANT
CATH CPS LOCATOR 3D MED (CATHETERS) IMPLANT
HELIX LOCKING TOOL (MISCELLANEOUS) ×1
KIT MICROPUNCTURE NIT STIFF (SHEATH) IMPLANT
LEAD ULTIPACE 52 LPA1231/52 (Lead) IMPLANT
LEAD ULTIPACE 65 LPA1231/65 (Lead) IMPLANT
PACEMAKER ASSURITY DR-RF (Pacemaker) IMPLANT
PAD DEFIB RADIO PHYSIO CONN (PAD) ×1 IMPLANT
SHEATH 7FR PRELUDE SNAP 13 (SHEATH) IMPLANT
SHEATH 9FR PRELUDE SNAP 13 (SHEATH) IMPLANT
SLITTER AGILIS HISPRO (INSTRUMENTS) IMPLANT
TOOL HELIX LOCKING (MISCELLANEOUS) IMPLANT
TRAY PACEMAKER INSERTION (PACKS) ×1 IMPLANT
WIRE HI TORQ VERSACORE-J 145CM (WIRE) IMPLANT

## 2022-04-26 NOTE — H&P (View-Only) (Signed)
ELECTROPHYSIOLOGY CONSULT NOTE    Patient ID: Scott Coleman MRN: [id number], DOB/AGE: Mar 12, 1935 87 y.o.  Admit date: 04/25/2022 Date of Consult: 04/26/2022  Primary Physician: No primary care provider on file. Primary Cardiologist: None  Electrophysiologist: New   Referring Provider: Dr. Terri Skains  Patient Profile: Scott Coleman is a 87 y.o. male with a history of CAD s/p CABG, DM2, HLD, HTN, h/o CVA, and h/o RCC who is being seen today for the evaluation of CHB at the request of Dr. Terri Skains.  HPI:  Pt presented via EMS 3/10 after his daughter went to check on him for not answering the phone, and was found in a recliner with N&V. Noted to have CHB with HR in the 30s and remained symptomatic so he was subcutaneously paced.  HB continued into arrival to ED and he was taken urgently for temp pacer.   Pt NOT on AV nodal agent.   Pt OK this am, just tired. He and daughter reports he was USOH until Sunday am. Pt states he felt fine Saturday, and daughter talked to him on the phone.  Sunday am he just felt "off". They went to the grocery together and she thought he was more SOB and easily fatigued. He really started feeling bad in late afternoon and had near syncope.      He denies CP, undue SOB, recent illness, BB use at home, or edema.   Labs Potassium4.2 (03/11 [id number]) Magnesium  2.1 (03/11 [id number]) Creatinine, ser  1.97* (03/11 [id number]) PLT  203 (03/11 0619) HGB  13.5 (03/11 0619) WBC 10.8* (03/11 [id number]) Troponin I (High Sensitivity)88* (03/11 0420).    Allergies, Past Medical, Surgical, Social, and Family Histories have been reviewed and are referenced here-in when relevant for medical decision making.    Medications Prior to Admission  Medication Sig Dispense Refill Last Dose   acetaminophen (TYLENOL) 500 MG tablet Take 500 mg by mouth every 6 (six) hours as needed (joint pain).      beta carotene w/minerals (OCUVITE) tablet Take 1 tablet by mouth daily.   04/25/2022 at AM   glimepiride  (AMARYL) 2 MG tablet Take 2 mg by mouth daily before breakfast.   04/25/2022 at AM   losartan-hydrochlorothiazide (HYZAAR) 100-12.5 MG tablet Take 1 tablet by mouth daily.   04/25/2022   metformin (FORTAMET) 500 MG (OSM) 24 hr tablet Take 500 mg by mouth every evening.   04/24/2022 at PM   Multiple Vitamin (MULTIVITAMIN) tablet Take 1 tablet by mouth daily.   04/25/2022 at AM   Omega-3 Fatty Acids (FISH OIL PO) Take by mouth 2 (two) times daily.   04/25/2022 at AM   simvastatin (ZOCOR) 40 MG tablet Take 40 mg by mouth every evening.   04/24/2022 at PM    Inpatient Medications:   aspirin EC  81 mg Oral Daily   Chlorhexidine Gluconate Cloth  6 each Topical Daily   fentaNYL       insulin aspart  0-15 Units Subcutaneous Q4H   insulin detemir  5 Units Subcutaneous QHS   pantoprazole  40 mg Oral Daily   simvastatin  40 mg Oral QPM   Physical Exam: Vitals:   04/26/22 0600 04/26/22 0700 04/26/22 0755 04/26/22 0800  BP: (!) 107/56 (!) 131/59  (!) 93/55  Pulse: 70 69 70 70  Resp: '13 18 18 16  '$ Temp:   98.4 F (36.9 C)   TempSrc:   Oral   SpO2: 96% 97% 98% 97%  Weight:  Height:        GEN- NAD, A&O x 3, normal affect HEENT: Normocephalic, atraumatic Lungs- CTAB, Normal effort.  Heart- Regular rate and rhythm, No M/G/R.  GI- Soft, NT, ND.  Extremities- No clubbing, cyanosis, or edema   Radiology/Studies: CXR 3/10 on arrival) ? LLL opacity and mild cardiomegaly without overt edema.   CXR 3/10 post temp wire No pneumothorax, stable IJ pacer lead in RV.   EKG: with EMS showed CHB at 33 bpm (personally reviewed)    TELEMETRY: V paced at 70, no underlying (personally reviewed)  Assessment/Plan: CHB S/p temp wire No AV nodal agents.  Echo pending as below.  Explained risks, benefits, and alternatives to PPM implantation, including but not limited to bleeding, infection, pneumothorax, pericardial effusion, lead dislodgement, heart attack, stroke, or death.  Pt verbalized understanding  and agrees to proceed.  CAD s/p CABG 2010 Denies s/s ischemia HS trop 34 -> 88 Echo pending (Last in system per notes 40-50% > 10 years ago) He denies need for ischemic work up since CABG.   Leukocytosis WBC 19.2 on arrival, afebrile. BCx drawn for completeness WBC 12.3 today.  ? Reactive vs aspiration with N&V  Tentatively would plan pacing today. HS trop flat and no symptoms, unlikely to have reversible cause. Could argue for LHC if EF is down, but suspect low likelihood of conduction return.   For questions or updates, please contact [place] Please consult [email or web address] for contact info under Cardiology/STEMI.  Jacalyn Lefevre, PA-C  04/26/2022 8:35 AM  EP attending  Patient seen and examined.  Agree with the findings as noted above.  The patient is a very pleasant 87 year old man with coronary artery disease status post bypass surgery and previously normal LV function who was admitted to the hospital after developing symptomatic complete heart block despite being on no AV nodal blocking drugs.  He has undergone insertion of a temporary transvenous pacemaker and feels better.  His heart block persists.  He was on no reversible agents and there is no reversible cause to his heart block.  On exam he is a pleasant elderly appearing man in no distress rest of his exam is accurately documented above.  Assessment and plan 1.  Complete heart block the patient will undergo permanent dual-chamber pacemaker insertion.  He will undergo 2D echo to discern whether dual-chamber or biventricular pacing is most appropriate.  I discussed the risk, goals, benefits, and expectations of the procedure with the patient and his daughter and they wish to proceed.  [name], MD

## 2022-04-26 NOTE — Interval H&P Note (Signed)
History and Physical Interval Note:  04/26/2022 3:21 PM  Scott Coleman  has presented today for surgery, with the diagnosis of heart block.  The various methods of treatment have been discussed with the patient and family. After consideration of risks, benefits and other options for treatment, the patient has consented to  Procedure(s): PACEMAKER IMPLANT (N/A) as a surgical intervention.  The patient's history has been reviewed, patient examined, no change in status, stable for surgery.  I have reviewed the patient's chart and labs.  Questions were answered to the patient's satisfaction.     Scott Coleman

## 2022-04-26 NOTE — Inpatient Diabetes Management (Signed)
Inpatient Diabetes Program Recommendations  AACE/ADA: New Consensus Statement on Inpatient Glycemic Control (2015)  Target Ranges:  Prepandial:   less than 140 mg/dL      Peak postprandial:   less than 180 mg/dL (1-2 hours)      Critically ill patients:  140 - 180 mg/dL    Latest Reference Range & Units 04/25/22 22:33 04/26/22 03:06 04/26/22 07:35 04/26/22 07:57  Glucose-Capillary 70 - 99 mg/dL 313 (H)  15 units Novolog '@2312'$   5 units Levemir '@0124'$  108 (H) 67 (L) 134 (H)  (H): Data is abnormally high (L): Data is abnormally low    Admit with: Symptomatic complete heart block/ STEMI  History: DM, CVA  Home DM Meds: Amaryl 2 mg daily  Current Orders: Levemir 5 units QHS      Novolog Moderate Correction Scale/ SSI (0-15 units) Q4 hours     MD- Note Mild Hypoglycemia at 7:35am today.  Not sure if the large dose of Novolog or the Levemir may have caused the low?  CBG 112 at 11:30am  If any more episodes of Hypoglycemia, amy consider d/c of the Levemir     --Will follow patient during hospitalization--  Wyn Quaker RN, MSN, [place] Diabetes Coordinator Inpatient Glycemic Control Team Team Pager: [phone] (8a-5p)

## 2022-04-26 NOTE — Progress Notes (Signed)
   04/26/22 0930  Spiritual Encounters  Type of Visit Initial  Care provided to: Pt and family  Referral source Patient request  Reason for visit Advance directives  OnCall Visit No   Chap responded to consult request for ACD. Pt was in discomfort and indicate chap should provide education regarding ACD to daughter who was present.  Chap sat with daughter and explained the information.  Daughter has "blue book" and will fill out.  Pt to contact [place] when paperwork completed and ready for Notary.

## 2022-04-26 NOTE — Progress Notes (Signed)
Progress Note  Patient Name: Scott Coleman MRN: [id number] DOB: 1935/05/26 Date of Encounter: 04/26/2022  Attending physician: Rex Kras, DO Primary care provider: Orpah Melter, MD   Subjective: Scott Coleman is a 87 y.o. Caucasian male who was seen and examined at bedside  Status post temporary pacemaker right IJ. Much more awake alert and oriented No chest pain currently or days leading up to the hospitalization. Denies shortness of breath, lower extremity swelling, PND or orthopnea. Was having episodes of near syncope yesterday to the point he got nauseated and started vomiting. Has not been following up with cardiology-he states I was released Case discussed and reviewed with his nurse.  Objective: Vital Signs in the last 24 hours: Temp:  [97.3 F (36.3 C)-98.4 F (36.9 C)] 98.4 F (36.9 C) (03/11 0755) Pulse Rate:  [66-80] 69 (03/11 1000) Resp:  [13-54] 16 (03/11 0900) BP: (93-155)/(50-124) 130/102 (03/11 1000) SpO2:  [93 %-100 %] 96 % (03/11 1000) Weight:  [71.2 kg-84.4 kg] 73.1 kg (03/11 0338)  Intake/Output:  Intake/Output Summary (Last 24 hours) at 04/26/2022 1013 Last data filed at 04/26/2022 1000 Gross per 24 hour  Intake 907.87 ml  Output 450 ml  Net 457.87 ml    Net IO Since Admission: 457.87 mL [04/26/22 1013]  Weights:     04/26/2022    3:38 AM 04/25/2022   10:28 PM 04/25/2022    8:35 PM  Last 3 Weights  Weight (lbs) 161 lb 2.5 oz 156 lb 15.5 oz 186 lb  Weight (kg) 73.1 kg 71.2 kg 84.369 kg      Telemetry:  Overnight telemetry shows ventricularly paced rhythm, which I personally reviewed.   Physical examination: PHYSICAL EXAM: Vitals:   04/26/22 0755 04/26/22 0800 04/26/22 0900 04/26/22 1000  BP:  (!) 93/55 126/70 (!) 130/102  Pulse: 70 70 69 69  Resp: '18 16 16   '$ Temp: 98.4 F (36.9 C)     TempSrc: Oral     SpO2: 98% 97% 98% 96%  Weight:      Height:        Physical Exam  Constitutional: No distress. He appears chronically  ill.  Appears older than stated age, hemodynamically stable.   Neck: No JVD present.  Right IJ temporary pacemaker Right carotid endarterectomy site is clean dry and well-healed   Cardiovascular: Normal rate, regular rhythm, S1 normal, S2 normal, intact distal pulses and normal pulses. Exam reveals no gallop, no S3 and no S4.  No murmur heard. Pulmonary/Chest: Effort normal and breath sounds normal. No stridor. He has no wheezes. He has no rales.  Temporary pacemaker pads present. Sternotomy site is well-healed  Abdominal: Soft. Bowel sounds are normal. He exhibits no distension. There is no abdominal tenderness.  Musculoskeletal:        General: No edema.     Cervical back: Neck supple.  Neurological: He is alert and oriented to person, place, and time. He has intact cranial nerves (2-12).  Skin: Skin is warm and moist.   Lab Results: Chemistry Recent Labs  Lab 04/25/22 2014 04/26/22 0420 04/26/22 0619  NA 136  --  140  K 3.2*  --  4.2  CL 98  --  105  CO2 19*  --  23  GLUCOSE 403*  --  87  BUN 41*  --  38*  CREATININE 2.46* 2.08* 1.97*  CALCIUM 8.7*  --  9.0  GFRNONAA 25* 31* 33*  ANIONGAP 19*  --  12    Hematology Recent Labs  Lab 04/25/22 2014 04/26/22 0420 04/26/22 0619  WBC 19.2* 12.3* 10.8*  RBC 4.45 4.43 4.29  HGB 13.9 14.3 13.5  HCT 43.5 40.8 39.6  MCV 97.8 92.1 92.3  MCH 31.2 32.3 31.5  MCHC 32.0 35.0 34.1  RDW 12.7 12.7 12.8  PLT 265 211 203   High Sensitivity Troponin:   Recent Labs  Lab 04/25/22 2014 04/26/22 0420  TROPONINIHS 34* 88*     Cardiac EnzymesNo results for input(s): "TROPONINI" in the last 168 hours. No results for input(s): "TROPIPOC" in the last 168 hours.  BNPNo results for input(s): "BNP", "PROBNP" in the last 168 hours.  DDimer No results for input(s): "DDIMER" in the last 168 hours.  Hemoglobin A1c:  Lab Results  Component Value Date   HGBA1C (H) 04/15/2008    8.6 (NOTE)   The ADA recommends the following therapeutic  goal for glycemic   control related to Hgb A1C measurement:   Goal of Therapy:   < 7.0% Hgb A1C   Reference: American Diabetes Association: Clinical Practice   Recommendations 2008, Diabetes Care,  2008, 31:(Suppl 1).   MPG 200 04/15/2008   TSH  Recent Labs    04/25/22 2014  TSH 5.554*   Lipid Panel No results found for: "CHOL", "HDL", "LDLCALC", "LDLDIRECT", "TRIG", "CHOLHDL" Drugs of Abuse  No results found for: "LABOPIA", "COCAINSCRNUR", "LABBENZ", "AMPHETMU", "THCU", "LABBARB"    Imaging: DG CHEST PORT 1 VIEW  Result Date: 04/25/2022 CLINICAL DATA:  Status post pacemaker EXAM: PORTABLE CHEST 1 VIEW COMPARISON:  Chest x-ray 04/25/2022 FINDINGS: New right IJ pacemaker lead tip projects over the right ventricle. Patient is status post cardiac surgery. Cardiac silhouette is borderline enlarged, unchanged. The lungs are clear. There is no pleural effusion or pneumothorax. No acute fractures are seen. Sternotomy wires are again noted. IMPRESSION: New right IJ pacemaker lead tip projects over the right ventricle. No pneumothorax. Electronically Signed   By: Ronney Asters M.D.   On: 04/25/2022 23:20   CARDIAC CATHETERIZATION  Result Date: 04/25/2022 Images from the original result were not included. 1. Ultrasound guided Rt IJ venous access 2. Fluoroscopy guided temporary pacemaker placement in RV apex Rate: 70 bpm Current: 10 mA (Threshold maintained even at 1 mA. He is pacemaker dependent. I did not try to check for threshold below 1 mA) Nigel Mormon, MD Pager: [phone] Office: [phone]  DG Chest Portable 1 View  Result Date: 04/25/2022 CLINICAL DATA:  Bradycardia requiring external pacing. Found down by family unresponsive. EXAM: PORTABLE CHEST 1 VIEW COMPARISON:  PA Lat 05/09/2020 FINDINGS: There is mild cardiomegaly, increased from prior study. Central vascular prominence and flow cephalization are noted but there is no overt edema. Increased streaky retrocardiac left lower  lobe opacity is noted and could be due to pneumonia or aspiration versus atelectasis. The remaining lungs are clear. There is no substantial pleural effusion. There are old CABG changes with stable mediastinum, mild aortic atherosclerosis. External pacing electrical pads overlie the left upper abdomen. No acute osseous findings. IMPRESSION: 1. Increased retrocardiac left lower lobe opacity which could be due to pneumonia or aspiration versus atelectasis. 2. Mild cardiomegaly and central vascular prominence without overt edema. 3. Aortic atherosclerosis. 4. Old CABG changes. Electronically Signed   By: Telford Nab M.D.   On: 04/25/2022 20:52    CARDIAC DATABASE: 04/17/2008: CABG 5 vessel bypass LIMA to the LAD, SVG to diagonal, SVG sequentially grafted to second and fourth obtuse marginal, SVG to RCA  EKG: EKG rhythm strip provided by  EMS notes complete heart block with ventricular escape beats.   Echocardiogram: Pending    Temporary pacemaker placed 04/25/2022.  Scheduled Meds:  aspirin EC  81 mg Oral Daily   Chlorhexidine Gluconate Cloth  6 each Topical Daily   insulin aspart  0-15 Units Subcutaneous Q4H   insulin detemir  5 Units Subcutaneous QHS   pantoprazole  40 mg Oral Daily   simvastatin  40 mg Oral QPM    Continuous Infusions:  sodium chloride 10 mL/hr at 04/26/22 1000   sodium chloride      PRN Meds: sodium chloride, docusate sodium, ondansetron (ZOFRAN) IV, mouth rinse, polyethylene glycol   IMPRESSION & RECOMMENDATIONS: Jakai Kirkeby is a 87 y.o. Caucasian male whose past medical history and cardiac risk factors include: Hypertension, hyperlipidemia, diabetes mellitus type 2, prostate cancer, history of stroke, right nephrectomy, carotid disease, CAD status post bypass.   Impression: Symptomatic complete heart block -status post temporary pacemaker Leukocytosis-improving likely reactive. Renal insufficiency-improving no baseline creatinine levels Establish  coronary artery disease history of 5 vessel bypass without angina pectoris Hypertension. Hyperlipidemia. Diabetes mellitus type 2. History of right nephrectomy. History of CVA. Carotid disease status post right carotid endarterectomy. Chewing tobacco   Recommendations: Patient presents to the hospital yesterday 04/25/2022 after having episodes of near syncope and intractable nausea and vomiting.  He did not respond to phone calls around 6-6:30 PM and so the daughter went to check up on him.  He is reclining in bed with vomit on the floor she called EMS and patient was brought to the ED.  Patient was noted to be in complete heart block with underlying sinus P waves but no conducted QRSs.  He had ventricular escape beats at times.  He was placed on external pacer pads Cath Lab was activated and a right IJ temp wire was placed.  Since the temporary pacemaker wire hemodynamically is improved.  He is more awake alert.  He denies chest pain or heart failure symptoms.  And he denies any such symptoms the days leading up to the hospitalization.  High sensitive troponins elevated but essentially flat not to suggest ACS  Leukocytosis improving therefore likely reactive.  However, since he was likely needing a permanent pacemaker did infectious workup with regards to respiratory panel, UA, blood cultures, and procalcitonin levels.  He has been afebrile.  Will hold off on antibiotics for now  Echocardiogram scheduled for this morning.  Consulted electrophysiology for evaluation of pacemaker.  Plan of care discussed with the patient, daughter, ex-wife, and nursing staff.  Will discontinue evening Lantus given his blood sugars this morning.  Appreciate the help for inpatient diabetic counselor/support staff.  Continue telemetry.  Further recommendations to follow.  Reemphasized importance of outpatient cardiology follow-up once discharge.  Will recheck hemoglobin A1c-since he had a lab error  yesterday night.  Fasting lipids pending.  Will check free T3 and T4.  Patient's questions and concerns were addressed to his satisfaction. He voices understanding of the instructions provided during this encounter.   This note was created using a voice recognition software as a result there may be grammatical errors inadvertently enclosed that do not reflect the nature of this encounter. Every attempt is made to correct such errors.  Mechele Claude [facility]  Pager:  [phone] Office: [phone] 04/26/2022, 10:13 AM

## 2022-04-26 NOTE — Progress Notes (Signed)
Hypoglycemic Event  CBG: 67  Treatment: 1/2 amp D50  Symptoms: none  Follow-up CBG: Time: [id number] CBG Result: 134  Possible Reasons for Event: NPO  Comments/MD notified: will notify MD during morning rounds   Scott Coleman

## 2022-04-26 NOTE — Consult Note (Addendum)
ELECTROPHYSIOLOGY CONSULT NOTE    Patient ID: Chibuikem Pall MRN: [id number], DOB/AGE: 23-May-1935 87 y.o.  Admit date: 04/25/2022 Date of Consult: 04/26/2022  Primary Physician: No primary care provider on file. Primary Cardiologist: None  Electrophysiologist: New   Referring Provider: Dr. Terri Skains  Patient Profile: Akintunde Bayley is a 87 y.o. male with a history of CAD s/p CABG, DM2, HLD, HTN, h/o CVA, and h/o RCC who is being seen today for the evaluation of CHB at the request of Dr. Terri Skains.  HPI:  Pt presented via EMS 3/10 after his daughter went to check on him for not answering the phone, and was found in a recliner with N&V. Noted to have CHB with HR in the 30s and remained symptomatic so he was subcutaneously paced.  HB continued into arrival to ED and he was taken urgently for temp pacer.   Pt NOT on AV nodal agent.   Pt OK this am, just tired. He and daughter reports he was USOH until Sunday am. Pt states he felt fine Saturday, and daughter talked to him on the phone.  Sunday am he just felt "off". They went to the grocery together and she thought he was more SOB and easily fatigued. He really started feeling bad in late afternoon and had near syncope.      He denies CP, undue SOB, recent illness, BB use at home, or edema.   Labs Potassium4.2 (03/11 [id number]) Magnesium  2.1 (03/11 [id number]) Creatinine, ser  1.97* (03/11 [id number]) PLT  203 (03/11 0619) HGB  13.5 (03/11 0619) WBC 10.8* (03/11 [id number]) Troponin I (High Sensitivity)88* (03/11 0420).    Allergies, Past Medical, Surgical, Social, and Family Histories have been reviewed and are referenced here-in when relevant for medical decision making.    Medications Prior to Admission  Medication Sig Dispense Refill Last Dose   acetaminophen (TYLENOL) 500 MG tablet Take 500 mg by mouth every 6 (six) hours as needed (joint pain).      beta carotene w/minerals (OCUVITE) tablet Take 1 tablet by mouth daily.   04/25/2022 at AM   glimepiride  (AMARYL) 2 MG tablet Take 2 mg by mouth daily before breakfast.   04/25/2022 at AM   losartan-hydrochlorothiazide (HYZAAR) 100-12.5 MG tablet Take 1 tablet by mouth daily.   04/25/2022   metformin (FORTAMET) 500 MG (OSM) 24 hr tablet Take 500 mg by mouth every evening.   04/24/2022 at PM   Multiple Vitamin (MULTIVITAMIN) tablet Take 1 tablet by mouth daily.   04/25/2022 at AM   Omega-3 Fatty Acids (FISH OIL PO) Take by mouth 2 (two) times daily.   04/25/2022 at AM   simvastatin (ZOCOR) 40 MG tablet Take 40 mg by mouth every evening.   04/24/2022 at PM    Inpatient Medications:   aspirin EC  81 mg Oral Daily   Chlorhexidine Gluconate Cloth  6 each Topical Daily   fentaNYL       insulin aspart  0-15 Units Subcutaneous Q4H   insulin detemir  5 Units Subcutaneous QHS   pantoprazole  40 mg Oral Daily   simvastatin  40 mg Oral QPM   Physical Exam: Vitals:   04/26/22 0600 04/26/22 0700 04/26/22 0755 04/26/22 0800  BP: (!) 107/56 (!) 131/59  (!) 93/55  Pulse: 70 69 70 70  Resp: '13 18 18 16  '$ Temp:   98.4 F (36.9 C)   TempSrc:   Oral   SpO2: 96% 97% 98% 97%  Weight:  Height:        GEN- NAD, A&O x 3, normal affect HEENT: Normocephalic, atraumatic Lungs- CTAB, Normal effort.  Heart- Regular rate and rhythm, No M/G/R.  GI- Soft, NT, ND.  Extremities- No clubbing, cyanosis, or edema   Radiology/Studies: CXR 3/10 on arrival) ? LLL opacity and mild cardiomegaly without overt edema.   CXR 3/10 post temp wire No pneumothorax, stable IJ pacer lead in RV.   EKG: with EMS showed CHB at 33 bpm (personally reviewed)    TELEMETRY: V paced at 70, no underlying (personally reviewed)  Assessment/Plan: CHB S/p temp wire No AV nodal agents.  Echo pending as below.  Explained risks, benefits, and alternatives to PPM implantation, including but not limited to bleeding, infection, pneumothorax, pericardial effusion, lead dislodgement, heart attack, stroke, or death.  Pt verbalized understanding  and agrees to proceed.  CAD s/p CABG 2010 Denies s/s ischemia HS trop 34 -> 88 Echo pending (Last in system per notes 40-50% > 10 years ago) He denies need for ischemic work up since CABG.   Leukocytosis WBC 19.2 on arrival, afebrile. BCx drawn for completeness WBC 12.3 today.  ? Reactive vs aspiration with N&V  Tentatively would plan pacing today. HS trop flat and no symptoms, unlikely to have reversible cause. Could argue for LHC if EF is down, but suspect low likelihood of conduction return.   For questions or updates, please contact [facility] Please consult [email or web address] for contact info under Cardiology/STEMI.  Jacalyn Lefevre, PA-C  04/26/2022 8:35 AM  EP attending  Patient seen and examined.  Agree with the findings as noted above.  The patient is a very pleasant 87 year old man with coronary artery disease status post bypass surgery and previously normal LV function who was admitted to the hospital after developing symptomatic complete heart block despite being on no AV nodal blocking drugs.  He has undergone insertion of a temporary transvenous pacemaker and feels better.  His heart block persists.  He was on no reversible agents and there is no reversible cause to his heart block.  On exam he is a pleasant elderly appearing man in no distress rest of his exam is accurately documented above.  Assessment and plan 1.  Complete heart block the patient will undergo permanent dual-chamber pacemaker insertion.  He will undergo 2D echo to discern whether dual-chamber or biventricular pacing is most appropriate.  I discussed the risk, goals, benefits, and expectations of the procedure with the patient and his daughter and they wish to proceed.  [name], MD

## 2022-04-27 ENCOUNTER — Encounter (HOSPITAL_COMMUNITY): Payer: Self-pay | Admitting: Internal Medicine

## 2022-04-27 ENCOUNTER — Inpatient Hospital Stay (HOSPITAL_COMMUNITY): Payer: Medicare HMO

## 2022-04-27 ENCOUNTER — Telehealth: Payer: Self-pay

## 2022-04-27 ENCOUNTER — Other Ambulatory Visit (HOSPITAL_COMMUNITY): Payer: Self-pay

## 2022-04-27 DIAGNOSIS — Z45018 Encounter for adjustment and management of other part of cardiac pacemaker: Secondary | ICD-10-CM

## 2022-04-27 HISTORY — DX: Encounter for adjustment and management of other part of cardiac pacemaker: Z45.018

## 2022-04-27 LAB — BASIC METABOLIC PANEL
Anion gap: 10 (ref 5–15)
BUN: 25 mg/dL — ABNORMAL HIGH (ref 8–23)
CO2: 20 mmol/L — ABNORMAL LOW (ref 22–32)
Calcium: 8.7 mg/dL — ABNORMAL LOW (ref 8.9–10.3)
Chloride: 105 mmol/L (ref 98–111)
Creatinine, Ser: 1.58 mg/dL — ABNORMAL HIGH (ref 0.61–1.24)
GFR, Estimated: 43 mL/min — ABNORMAL LOW (ref >60)
Glucose, Bld: 103 mg/dL — ABNORMAL HIGH (ref 70–99)
Potassium: 4.4 mmol/L (ref 3.5–5.1)
Sodium: 135 mmol/L (ref 135–145)

## 2022-04-27 LAB — GLUCOSE, CAPILLARY
Glucose-Capillary: 132 mg/dL — ABNORMAL HIGH (ref 70–99)
Glucose-Capillary: 158 mg/dL — ABNORMAL HIGH (ref 70–99)
Glucose-Capillary: 198 mg/dL — ABNORMAL HIGH (ref 70–99)
Glucose-Capillary: 215 mg/dL — ABNORMAL HIGH (ref 70–99)
Glucose-Capillary: 231 mg/dL — ABNORMAL HIGH (ref 70–99)
Glucose-Capillary: 88 mg/dL (ref 70–99)

## 2022-04-27 LAB — CBC
HCT: 40 % (ref 39.0–52.0)
Hemoglobin: 14.1 g/dL (ref 13.0–17.0)
MCH: 32.3 pg (ref 26.0–34.0)
MCHC: 35.3 g/dL (ref 30.0–36.0)
MCV: 91.5 fL (ref 80.0–100.0)
Platelets: 185 10*3/uL (ref 150–400)
RBC: 4.37 MIL/uL (ref 4.22–5.81)
RDW: 12.8 % (ref 11.5–15.5)
WBC: 9.3 10*3/uL (ref 4.0–10.5)
nRBC: 0 % (ref 0.0–0.2)

## 2022-04-27 LAB — T3, FREE: T3, Free: 2.6 pg/mL (ref 2.0–4.4)

## 2022-04-27 MED ORDER — AMLODIPINE BESYLATE 5 MG PO TABS
5.0000 mg | ORAL_TABLET | Freq: Every day | ORAL | Status: DC
  Administered 2022-04-27 – 2022-04-30 (×4): 5 mg via ORAL
  Filled 2022-04-27 (×4): qty 1

## 2022-04-27 MED ORDER — CARVEDILOL 6.25 MG PO TABS
6.2500 mg | ORAL_TABLET | Freq: Two times a day (BID) | ORAL | 2 refills | Status: DC
  Filled 2022-04-27: qty 60, 30d supply, fill #0

## 2022-04-27 MED ORDER — LOSARTAN POTASSIUM-HCTZ 100-12.5 MG PO TABS: 1.0000 | ORAL_TABLET | Freq: Every day | ORAL | Status: DC

## 2022-04-27 MED ORDER — CARVEDILOL 6.25 MG PO TABS
6.2500 mg | ORAL_TABLET | Freq: Two times a day (BID) | ORAL | Status: DC
  Filled 2022-04-27: qty 1

## 2022-04-27 NOTE — Evaluation (Signed)
Occupational Therapy Evaluation Patient Details Name: Scott Coleman MRN: [id number] DOB: 12-25-1935 Today's Date: 04/27/2022   History of Present Illness 87 y.o. male admitted 3/10 with AMS and found to be in complete heart block. 3/11 PPM placed. PMHx: CAD s/p CABG, DM2, HLD, HTN, CVA   Clinical Impression   Patient admitted for the diagnosis and procedure above.  PTA the patient lives alone, and has only PRN assist from his daughter for meals.  The daughter has chronic hip dysfunction, and cannot provide physical assist for mobility or ADL.  The patient states he also has a "bad hip", and has been having increased difficulty with mobility at home, but with increased time and effort was still walking household distances with a RW.  In addition, he was able to complete his own ADL, but again, needed increased time, rest breaks and increased effort.  Currently he is needing up to Mod A for lower body ADL, and Min A for basic mobility.  OT is indicated in the acute setting to address deficits listed, and assist with transition to the next level of care.  Patient would benefit from initial 24 hour care, and a more intensive rehab program post acute.         Recommendations for follow up therapy are one component of a multi-disciplinary discharge planning process, led by the attending physician.  Recommendations may be updated based on patient status, additional functional criteria and insurance authorization.   Follow Up Recommendations  Skilled nursing-short term rehab (<3 hours/day)     Assistance Recommended at Discharge Frequent or constant Supervision/Assistance  Patient can return home with the following Help with stairs or ramp for entrance;Assist for transportation;Assistance with cooking/housework;A lot of help with bathing/dressing/bathroom;A lot of help with walking and/or transfers    Functional Status Assessment  Patient has had a recent decline in their functional status and  demonstrates the ability to make significant improvements in function in a reasonable and predictable amount of time.  Equipment Recommendations  None recommended by OT    Recommendations for Other Services       Precautions / Restrictions Precautions Precautions: Fall;ICD/Pacemaker Restrictions Weight Bearing Restrictions: Yes LUE Weight Bearing: Non weight bearing Other Position/Activity Restrictions: limit pushing/pulling to L UE and AROM to 90 degrees L UE.      Mobility Bed Mobility Overal bed mobility: Needs Assistance Bed Mobility: Supine to Sit     Supine to sit: Min assist          Transfers Overall transfer level: Needs assistance   Transfers: Sit to/from Stand Sit to Stand: Min assist                  Balance Overall balance assessment: Needs assistance, History of Falls Sitting-balance support: Feet supported Sitting balance-Leahy Scale: Good     Standing balance support: Reliant on assistive device for balance Standing balance-Leahy Scale: Poor                             ADL either performed or assessed with clinical judgement   ADL Overall ADL's : Needs assistance/impaired Eating/Feeding: Set up;Sitting   Grooming: Wash/dry hands;Wash/dry face;Oral care;Min guard;Standing   Upper Body Bathing: Minimal assistance;Sitting   Lower Body Bathing: Minimal assistance;Moderate assistance;Sit to/from stand Lower Body Bathing Details (indicate cue type and reason): balance assist and difficulty reaching for R foot Upper Body Dressing : Minimal assistance;Sitting Upper Body Dressing Details (indicate cue type and reason):  cues to dress L arm first Lower Body Dressing: Minimal assistance;Moderate assistance;Sit to/from stand   Toilet Transfer: Minimal assistance;Rolling walker (2 wheels);Regular Toilet;Ambulation                   Vision Patient Visual Report: No change from baseline       Perception     Praxis       Pertinent Vitals/Pain Pain Assessment Pain Assessment: Faces Faces Pain Scale: Hurts even more Pain Location: Sore to L shoulder, and chronic hip pain Pain Descriptors / Indicators: Aching, Grimacing, Guarding, Sore Pain Intervention(s): Monitored during session     Hand Dominance Right   Extremity/Trunk Assessment Upper Extremity Assessment Upper Extremity Assessment: LUE deficits/detail LUE Deficits / Details: soreness with AROM below 90 degress for shoulder flexion and chest press. LUE Sensation: WNL LUE Coordination: WNL   Lower Extremity Assessment Lower Extremity Assessment: Defer to PT evaluation   Cervical / Trunk Assessment Cervical / Trunk Assessment: Normal   Communication Communication Communication: No difficulties   Cognition Arousal/Alertness: Awake/alert Behavior During Therapy: WFL for tasks assessed/performed Overall Cognitive Status: Within Functional Limits for tasks assessed Area of Impairment: Memory                     Memory: Decreased short-term memory, Decreased recall of precautions         General Comments: Patient performed well on short blessed except for memory.  Patient may need assist with medication management at home.  Could not verbalize PPM precautions, but demonstrated precautions well - soreness may also be limiting AROM.     General Comments   HR stable in the 80's with mobility    Exercises     Shoulder Instructions      Home Living Family/patient expects to be discharged to:: Private residence Living Arrangements: Alone Available Help at Discharge: Family;Available 24 hours/day Type of Home: House Home Access: Stairs to enter [employer] of Steps: 1 Entrance Stairs-Rails: Right Home Layout: One level     Bathroom Shower/Tub: Occupational psychologist: Standard Bathroom Accessibility: Yes How Accessible: Accessible via walker Home Equipment: [place] (2 wheels);Cane - single  point;Shower seat          Prior Functioning/Environment Prior Level of Function : Needs assist             Mobility Comments: Per PT eval: walks with RW, mainly household ambulator ADLs Comments: Drives limited distance, uses  motorized scooter in stores, daughter does the shopping and cleaning.  Patient able to do light home management and light meal prep.  Daughter drops off meals a few times a week.        OT Problem List: Decreased strength;Decreased range of motion;Decreased activity tolerance;Impaired balance (sitting and/or standing);Impaired UE functional use;Decreased safety awareness;Pain      OT Treatment/Interventions: Self-care/ADL training;Therapeutic activities;Patient/family education;Balance training;DME and/or AE instruction    OT Goals(Current goals can be found in the care plan section) Acute Rehab OT Goals Patient Stated Goal: Get to where I'm better and can walk more OT Goal Formulation: With patient Time For Goal Achievement: 05/11/22 Potential to Achieve Goals: Good ADL Goals Pt Will Perform Grooming: with modified independence;standing Pt Will Perform Upper Body Bathing: with modified independence;sitting Pt Will Perform Lower Body Bathing: with set-up;sit to/from stand Pt Will Perform Upper Body Dressing: with modified independence;sitting Pt Will Perform Lower Body Dressing: with set-up;sit to/from stand Pt Will Transfer to Toilet: with modified independence;ambulating;regular height toilet  OT Frequency: Min 2X/week    Co-evaluation              AM-PAC OT "6 Clicks" Daily Activity     Outcome Measure Help from another person eating meals?: None Help from another person taking care of personal grooming?: A Little Help from another person toileting, which includes using toliet, bedpan, or urinal?: A Little Help from another person bathing (including washing, rinsing, drying)?: A Lot Help from another person to put on and taking off regular  upper body clothing?: A Little Help from another person to put on and taking off regular lower body clothing?: A Lot 6 Click Score: 17   End of Session Equipment Utilized During Treatment: Gait belt;Rolling walker (2 wheels) Nurse Communication: Mobility status  Activity Tolerance: Patient tolerated treatment well Patient left: in chair;with call bell/phone within reach;with chair alarm set  OT Visit Diagnosis: Unsteadiness on feet (R26.81);Muscle weakness (generalized) (M62.81);History of falling (Z91.81);Pain Pain - Right/Left: Left Pain - part of body: Shoulder                Time: [id number] OT Time Calculation (min): 25 min Charges:  OT General Charges $OT Visit: 1 Visit OT Evaluation $OT Eval Moderate Complexity: 1 Mod OT Treatments $Self Care/Home Management : 8-22 mins  04/27/2022  RP, OTR/L  Acute Rehabilitation Services  Office:  [phone]   Metta Clines 04/27/2022, 4:43 PM

## 2022-04-27 NOTE — Progress Notes (Signed)
Subjective:  Feels well No complaints  Had difficulty walking to the wheelchair   Current Facility-Administered Medications:    0.9 %  sodium chloride infusion, , Intravenous, PRN, Evans Lance, MD, Stopped at 04/26/22 1034   acetaminophen (TYLENOL) tablet 325-650 mg, 325-650 mg, Oral, Q4H PRN, Evans Lance, MD, 650 mg at 04/27/22 0347   amLODipine (NORVASC) tablet 5 mg, 5 mg, Oral, Daily, Amonie Wisser J, MD, 5 mg at 04/27/22 1042   aspirin EC tablet 81 mg, 81 mg, Oral, Daily, Evans Lance, MD, 81 mg at 04/27/22 0948   Chlorhexidine Gluconate Cloth 2 % PADS 6 each, 6 each, Topical, Daily, Evans Lance, MD, 6 each at 04/27/22 0953   docusate sodium (COLACE) capsule 100 mg, 100 mg, Oral, BID PRN, Evans Lance, MD   insulin aspart (novoLOG) injection 0-15 Units, 0-15 Units, Subcutaneous, Q4H, Evans Lance, MD, 3 Units at 04/27/22 1135   insulin detemir (LEVEMIR) injection 5 Units, 5 Units, Subcutaneous, QHS, Evans Lance, MD, 5 Units at 04/26/22 2148   ondansetron [facility]) injection 4 mg, 4 mg, Intravenous, Q6H PRN, Evans Lance, MD   ondansetron [facility]) injection 4 mg, 4 mg, Intravenous, Q6H PRN, Evans Lance, MD   Oral care mouth rinse, 15 mL, Mouth Rinse, PRN, Evans Lance, MD   pantoprazole (PROTONIX) EC tablet 40 mg, 40 mg, Oral, Daily, Evans Lance, MD, 40 mg at 04/27/22 0948   polyethylene glycol (MIRALAX / GLYCOLAX) packet 17 g, 17 g, Oral, Daily PRN, Evans Lance, MD   simvastatin (ZOCOR) tablet 40 mg, 40 mg, Oral, QPM, Evans Lance, MD, 40 mg at 04/26/22 1722   Objective:  Vital Signs in the last 24 hours: Temp:  [97.5 F (36.4 C)-99.2 F (37.3 C)] 98 F (36.7 C) (03/12 1124) Pulse Rate:  [64-76] 67 (03/12 1200) Resp:  [15-23] 22 (03/12 1200) BP: (116-168)/(57-106) 168/81 (03/12 1200) SpO2:  [95 %-100 %] 95 % (03/12 1200) Weight:  [70.6 kg] 70.6 kg (03/12 0500)  Intake/Output from previous day: 03/11 0701 - 03/12 0700 In:  811.3 [I.V.:611.2; IV Piggyback:200.1] Out: 1400 [Urine:1400]  Physical Exam Vitals and nursing note reviewed.  Constitutional:      General: He is not in acute distress. Neck:     Vascular: No JVD.  Cardiovascular:     Rate and Rhythm: Normal rate and regular rhythm.     Heart sounds: Normal heart sounds. No murmur heard. Pulmonary:     Effort: Pulmonary effort is normal.     Breath sounds: Normal breath sounds. No wheezing or rales.  Chest:     Comments: Pacemaker pocket with no hematoma Musculoskeletal:     Right lower leg: No edema.     Left lower leg: No edema.      Imaging/tests reviewed and independently interpreted:  CXR 04/27/2022: New pacing device in place with the leads in the right atrium and right ventricle. No pneumothorax. No acute disease. Aortic Atherosclerosis  Cardiac Studies:  Telemetry 04/27/2022: A sensed V paced rhythm  EKG 04/27/2022: A sensed V paced rhythm  Echocardiogram 04/26/2022:  1. Left ventricular ejection fraction, by estimation, is 45 to 50%. The  left ventricle has mildly decreased function. There is mild left  ventricular hypertrophy. Left ventricular diastolic function could not be  evaluated. Left ventricular diastolic  function could not be evaluated due to paced rhythm. Mild septal  hypokinesis likely due to post-op septum.   2. Right ventricular systolic function  is normal. The right ventricular  size is normal.   3. The mitral valve is degenerative. Mild mitral valve regurgitation. No  evidence of mitral stenosis.   4. The aortic valve is tricuspid. Aortic valve regurgitation is trivial.  Aortic valve sclerosis is present, with no evidence of aortic valve  stenosis.   5. The inferior vena cava is normal in size with greater than 50%  respiratory variability, suggesting right atrial pressure of 3 mmHg.   6. Rhythm strip during this exam demonstrates a paced rhythm.   Comparison(s): No prior Echocardiogram.     Assessment & Recommendations:  87 y/o Caucasian male with hypertension, hyperlipidemia, type 2 DM, CAD s/p CABG 2012 (LIMA-LAD, SVG-diag, seq SVG-OM2, OM4, SVG-RCA), h/o stroke, h/o carotid endarterectomys/p prostate cancer, h/o nephrectomy, with complete AV block and asystole, now s/p dual chamber PPM  Complete AV block: Now s/p dual chamber PPM placement.  Hypertension: Controlled. He has been off his home medication losartan-HCTZ due to AKI. Amlodipine 5 mg daily for now. He should be able to resume losartan-HCTZ outpatient as Cr improves  AKI: Secondary to compete AV block. Now improving  CAD: S/p CABG Troop elevation secondary to AV block  Type 2 DM: BG 400 on presentation, now improved.   Disposition: Appreciate OPT/PT input. Daughter unsure if she can take care of him at home. Home health vs SNF.    Nigel Mormon, MD Pager: [phone] Office: [phone]

## 2022-04-27 NOTE — TOC Initial Note (Signed)
Transition of Care [facility]) - Initial/Assessment Note    Patient Details  Name: Scott Coleman MRN: [id number] Date of Birth: 1935-04-20  [facility] CM/SW Contact:    Bethena Roys, RN Phone Number: 04/27/2022, 2:17 PM  Clinical Narrative:  Case Manager received a referral for home needs. PT has worked with the patient with recommendations for [facility] PT. PTA patient was from home alone. Patient's daughter and ex spouse is at the bedside and daughter states she works full time and will not be able to stay with the patient. Patient has DME rolling walker that he uses in the home. Daughter takes the patient meals every other day; otherwise, he prepares sandwiches and eats breakfast bars.Daughter states she takes him to all appointments. Case Manager discussed if the daughter will be able to stay initially and then look into personal care agencies for additional assistance-daughter is agreeable to the plan. Daughter states she is not prepared for the patient to d/c home today-she will call her job to see if she can be off the next two days to provide supervision. Daughter is agreeable to [facility] PT/OT/Aide services with Alvis Lemmings. Referral was submitted to [facility] and start of care to begin within 24-48 hours post transition home. MD aware to write orders for the above disciplines. Case Manager provided personal care brochures to the family and they will call to arrange those services thereafter. Case Manager will continue to follow for additional needs.                   Alpha received a call from the daughter and stated she saw the patient being moved in the bed- he required a 2 person staff  assist and that she will not be able to assist the patient in the home like that due to her health reasons. Case Manager asked if PT can reevaluate the patient- he may benefit from SNF since he does not have supervision. Daughter is also now saying patient's income is limited and they cannot  afford personal care services. Case Manager will continue to follow for additional transition of care needs.   Expected Discharge Plan: [place] Barriers to Discharge: Continued Medical Work up   Patient Goals and CMS Choice Patient states their goals for this hospitalization and ongoing recovery are:: to return home.   Choice offered to / list presented to : Adult Children (Ex-spouse was at the bedside with daughter.)      Expected Discharge Plan and Services In-house Referral: NA Discharge Planning Services: CM Consult Post Acute Care Choice: [place] arrangements for the past 2 months: Single Family Home Expected Discharge Date: 04/27/22                         HH Arranged: PT, OT, Nurse's Aide HH Agency: [place] Date [facility] Agency Contacted: 04/27/22 Time [place]: 1130 Representative spoke with at [place]  Prior Living Arrangements/Services Living arrangements for the past 2 months: [place] with:: Self Patient language and need for interpreter reviewed:: Yes Do you feel safe going back to the place where you live?: Yes      Need for Family Participation in Patient Care: Yes (Comment) Care giver support system in place?: Yes (comment)   Criminal Activity/Legal Involvement Pertinent to Current Situation/Hospitalization: No - Comment as needed  Activities of Daily Living Home Assistive Devices/Equipment: Gilford Rile (specify type) ADL  Screening (condition at time of admission) Patient's cognitive ability adequate to safely complete daily activities?: Yes Is the patient deaf or have difficulty hearing?: No Does the patient have difficulty seeing, even when wearing glasses/contacts?: No Does the patient have difficulty concentrating, remembering, or making decisions?: No Patient able to express need for assistance with ADLs?: Yes Does the patient have difficulty dressing or bathing?:  No Independently performs ADLs?: Yes (appropriate for developmental age) Does the patient have difficulty walking or climbing stairs?: Yes Weakness of Legs: Both Weakness of Arms/Hands: None  Permission Sought/Granted Permission sought to share information with : Family Supports, Customer service manager, Case Optician, dispensing granted to share information with : Yes, Verbal Permission Granted     Permission granted to share info w AGENCY: [place]        Emotional Assessment Appearance:: Appears stated age Attitude/Demeanor/Rapport: Unable to Assess Affect (typically observed): Unable to Assess Orientation: : Oriented to Self Alcohol / Substance Use: Not Applicable Psych Involvement: No (comment)  Admission diagnosis:  Complete heart block ([place]) [I44.2] Patient Active Problem List   Diagnosis Date Noted   Third degree heart block ([place]) 04/26/2022   Leukocytosis 04/26/2022   Renal insufficiency 04/26/2022   Heart block AV complete ([place]) 04/25/2022   Coronary artery disease involving native coronary artery of native heart without angina pectoris 04/25/2022   Hx of CABG 04/25/2022   Mixed hyperlipidemia 04/25/2022   Benign hypertension 04/25/2022   History of nephrectomy, right 04/25/2022   History of carotid endarterectomy 04/25/2022   Bilateral carotid artery stenosis 04/25/2022   Chewing tobacco use 04/25/2022   PCP:  Orpah Melter, MD Pharmacy:   [facility] [address] [place] [address] Phone: [phone] Fax: [phone]     Social Determinants of Health (SDOH) Social History: SDOH Screenings   Food Insecurity: No Food Insecurity (04/25/2022)  Housing: Low Risk  (04/25/2022)  Transportation Needs: No Transportation Needs (04/25/2022)  Utilities: Not At Risk (04/25/2022)  Tobacco Use: Unknown (04/27/2022)   SDOH Interventions:     Readmission Risk Interventions     No data to display

## 2022-04-27 NOTE — Evaluation (Signed)
Physical Therapy Evaluation Patient Details Name: Imhotep Score MRN: [id number] DOB: 27-Feb-1935 Today's Date: 04/27/2022  History of Present Illness  87 y.o. male admitted 3/10 with AMS and found to be in complete heart block. 3/11 PPM placed. PMHx: CAD s/p CABG, DM2, HLD, HTN, CVA  Clinical Impression  Pt with flat affect, decreased awareness and memory needing mod cues to recall and maintain precautions throughout session. Pt with decreased strength, balance and safety who lives alone but states his daughter can stay with him. Pt currently min assist for all mobility and will need assist to mobilize and maintain precautions to return home. Pt will benefit from acute therapy to maximize mobility, safety and independence to decrease burden of care and improve awareness of precautions. Encouraged mobility with staff and maintained sling at rest to increase pt adherence to precautions.   HR 66 SPO2 98% on RA BP 152/71 (93)        Recommendations for follow up therapy are one component of a multi-disciplinary discharge planning process, led by the attending physician.  Recommendations may be updated based on patient status, additional functional criteria and insurance authorization.  Follow Up Recommendations Home health PT      Assistance Recommended at Discharge Frequent or constant Supervision/Assistance  Patient can return home with the following  A little help with walking and/or transfers;A lot of help with bathing/dressing/bathroom;Assistance with cooking/housework;Assist for transportation;Help with stairs or ramp for entrance    Equipment Recommendations None recommended by PT  Recommendations for Other Services       Functional Status Assessment Patient has had a recent decline in their functional status and demonstrates the ability to make significant improvements in function in a reasonable and predictable amount of time.     Precautions / Restrictions  Precautions Precautions: Fall;ICD/Pacemaker Restrictions Weight Bearing Restrictions: Yes LUE Weight Bearing: Non weight bearing      Mobility  Bed Mobility Overal bed mobility: Needs Assistance Bed Mobility: Rolling, Sidelying to Sit Rolling: Min assist Sidelying to sit: Min assist       General bed mobility comments: min assist to roll to right and rise from side with mod cues for precautions and sequence    Transfers Overall transfer level: Needs assistance   Transfers: Sit to/from Stand Sit to Stand: Min assist           General transfer comment: min assist to rise with mod cues for LUE positioning and precautions    Ambulation/Gait Ambulation/Gait assistance: Min assist Gait Distance (Feet): 100 Feet Assistive device: Rolling walker (2 wheels) Gait Pattern/deviations: Step-through pattern, Decreased stride length   Gait velocity interpretation: <1.8 ft/sec, indicate of risk for recurrent falls   General Gait Details: pt with cues for proximity to RW to maintain precautions and cues to look up as pt maintains cervical flexion  Stairs            Wheelchair Mobility    Modified Rankin (Stroke Patients Only)       Balance Overall balance assessment: Needs assistance, History of Falls   Sitting balance-Leahy Scale: Fair Sitting balance - Comments: supervision EOB   Standing balance support: Bilateral upper extremity supported, Reliant on assistive device for balance Standing balance-Leahy Scale: Poor Standing balance comment: Rw for standing with guarding for balance                             Pertinent Vitals/Pain Pain Assessment Pain Assessment: No/denies pain  Home Living Family/patient expects to be discharged to:: Private residence Living Arrangements: Alone Available Help at Discharge: Family;Available 24 hours/day Type of Home: House Home Access: Stairs to enter Entrance Stairs-Rails: Right Entrance Stairs-Number of  Steps: 1   Home Layout: One level Home Equipment: Conservation officer, nature (2 wheels);Cane - single point;Shower seat      Prior Function Prior Level of Function : Needs assist             Mobility Comments: walks with RW, mainly household ambulator ADLs Comments: drives limited distance, uses  motorized scooter in stores, daughter does the shopping and cleaning     Hand Dominance        Extremity/Trunk Assessment   Upper Extremity Assessment Upper Extremity Assessment: Overall WFL for tasks assessed    Lower Extremity Assessment Lower Extremity Assessment: Overall WFL for tasks assessed    Cervical / Trunk Assessment Cervical / Trunk Assessment: Normal (tendency for neck flexion)  Communication   Communication: No difficulties  Cognition Arousal/Alertness: Awake/alert Behavior During Therapy: Flat affect Overall Cognitive Status: Impaired/Different from baseline Area of Impairment: Memory                     Memory: Decreased recall of precautions                  General Comments      Exercises     Assessment/Plan    PT Assessment Patient needs continued PT services  PT Problem List Decreased strength;Decreased mobility;Decreased safety awareness;Decreased activity tolerance;Decreased balance;Decreased cognition;Decreased knowledge of precautions       PT Treatment Interventions Gait training;Therapeutic exercise;Patient/family education;DME instruction;Therapeutic activities;Stair training;Functional mobility training;Balance training    PT Goals (Current goals can be found in the Care Plan section)  Acute Rehab PT Goals Patient Stated Goal: return home, go fishing PT Goal Formulation: With patient Time For Goal Achievement: 05/11/22 Potential to Achieve Goals: Good    Frequency Min 3X/week     Co-evaluation               AM-PAC PT "6 Clicks" Mobility  Outcome Measure Help needed turning from your back to your side while in a flat  bed without using bedrails?: A Little Help needed moving from lying on your back to sitting on the side of a flat bed without using bedrails?: A Little Help needed moving to and from a bed to a chair (including a wheelchair)?: A Little Help needed standing up from a chair using your arms (e.g., wheelchair or bedside chair)?: A Little Help needed to walk in hospital room?: A Little Help needed climbing 3-5 steps with a railing? : A Lot 6 Click Score: 17    End of Session Equipment Utilized During Treatment: Gait belt Activity Tolerance: Patient tolerated treatment well Patient left: in chair;with call bell/phone within reach;Other (comment);with chair alarm set (sling reapplied in sitting) Nurse Communication: Mobility status;Precautions PT Visit Diagnosis: Other abnormalities of gait and mobility (R26.89);Muscle weakness (generalized) (M62.81)    Time: [id number] PT Time Calculation (min) (ACUTE ONLY): 20 min   Charges:   PT Evaluation $PT Eval Moderate Complexity: 1 Mod          [place], PT Acute Rehabilitation Services Office: [phone]   Sandy Salaam Shuna Tabor 04/27/2022, 10:19 AM

## 2022-04-27 NOTE — Discharge Summary (Signed)
Physician Discharge Summary  Patient ID: Brittin Cease MRN: [id number] DOB/AGE: 1935/03/14 87 y.o. Orpah Melter, MD   Admit date: 04/25/2022 Discharge date: 04/30/2022  Primary Discharge Diagnosis 1.  Complete heart block s/p Successful implantation of a St. Jude Assurity Dr-Rf [id number] dual-chamber pacemaker 2.  CAD SP CABG x 5 3.  Primary hypertension 4.  Hypercholesterolemia 5.  SP right carotid endarterectomy 6.  Diabetes mellitus  Significant Diagnostic Studies:  Echocardiogram 04/26/2022:  1. Left ventricular ejection fraction, by estimation, is 45 to 50%. The left ventricle has mildly decreased function. There is mild left ventricular hypertrophy. Left ventricular diastolic function could not be evaluated. Left ventricular diastolic  function could not be evaluated due to paced rhythm. Mild septal hypokinesis likely due to post-op septum.  2. Right ventricular systolic function is normal. The right ventricular size is normal.  3. The mitral valve is degenerative. Mild mitral valve regurgitation. No evidence of mitral stenosis.  4. The aortic valve is tricuspid. Aortic valve regurgitation is trivial. Aortic valve sclerosis is present, with no evidence of aortic valve stenosis.  5. The inferior vena cava is normal in size with greater than 50% respiratory variability, suggesting right atrial pressure of 3 mmHg.  6. Rhythm strip during this exam demonstrates a paced rhythm.   EP PPM/ICD IMPLANT  04/26/2022 1. Successful implantation of a St. Jude Assurity Dr-Rf [id number] dual-chamber pacemaker for symptomatic bradycardia due to CHB   2. Successful removal of a temporary TV PM 3. No early apparent complications.       [name], MD 04/26/2022 7:00 PM   Radiology:  DG Chest Portable 1 View 04/25/2022 1. Increased retrocardiac left lower lobe opacity which could be due to pneumonia or aspiration versus atelectasis.  2. Mild cardiomegaly and central vascular prominence  without overt edema.  3. Aortic atherosclerosis.  4. Old CABG changes.  Hospital Course: Sheppard Mcclammy is a 87 y.o. male  patient Hypertension, hyperlipidemia, diabetes mellitus type 2, prostate cancer, right nephrectomy for renal cell carcinoma in 2005, history of stroke, , carotid disease S/P right CEA, CAD status post bypass.  Admitted to the hospital with altered mental status and complete heart block on 04/25/2022  Temporary pacemaker insertion emergently followed by dual-chamber [place] pacemaker on 11/26/2022.  He had plans to be discharged on 04/27/2022 but family voiced concern for taking care of him at home and requested skilled nursing facility.  Patient denies anginal discomfort or heart failure symptoms.  He is sitting up in bed.  Pacemaker site is clean dry and intact.  Recommendations on discharge: Follow-up with primary care physician in the next 1 to 2 weeks status postdischarge.  Reestablish care with cardiology-tentatively scheduled for 05/11/2022 at 2:30 PM  Establish care with electrophysiology as recommended/requested given the recently implanted pacemaker.  Reemphasized importance of improving his modifiable cardiovascular risk factors.  Medications have already been delivered by [facility] pharmacy to bedside.  Discharge to SNF when bed available.  Plan of care discussed with patient and daughter) at bedside  Discharge Exam:    04/30/2022    6:02 AM 04/30/2022   12:08 AM 04/29/2022    8:15 PM  Vitals with BMI  Weight 154 lbs 9 oz    BMI Q000111Q    Systolic 0000000 [id number]  Diastolic 71 85 57  Pulse 63 74 70     Physical Exam Vitals reviewed.  Constitutional:      Appearance: He is not ill-appearing or toxic-appearing.  Eyes:  General: No scleral icterus.       Right eye: No discharge.        Left eye: No discharge.  Neck:     Vascular: No carotid bruit or JVD.     Trachea: No tracheal deviation.  Cardiovascular:     Rate and Rhythm: Normal  rate and regular rhythm.     Heart sounds: S1 normal and S2 normal. No murmur heard.    No gallop.     Comments: Pacemaker site is clean dry and intact. Pulmonary:     Effort: No respiratory distress.     Breath sounds: Normal breath sounds and air entry. No wheezing or rales.  Abdominal:     General: Bowel sounds are normal. There is no distension.     Palpations: Abdomen is soft.     Tenderness: There is no abdominal tenderness. There is no guarding or rebound.  Musculoskeletal:     Right lower leg: No edema.     Left lower leg: No edema.  Neurological:     Mental Status: He is alert.    Labs:   Lab Results  Component Value Date   WBC 7.5 04/30/2022   HGB 13.5 04/30/2022   HCT 39.7 04/30/2022   MCV 93.0 04/30/2022   PLT 187 04/30/2022    Recent Labs  Lab 04/30/22 0047  NA 135  K 4.2  CL 104  CO2 24  BUN 29*  CREATININE 1.58*  CALCIUM 8.6*  GLUCOSE 131*    Lipid Panel     Component Value Date/Time   CHOL 112 04/26/2022 0619   TRIG 127 04/26/2022 0619   HDL 33 (L) 04/26/2022 0619   CHOLHDL 3.4 04/26/2022 0619   VLDL 25 04/26/2022 0619   LDLCALC 54 04/26/2022 0619   HEMOGLOBIN A1C Lab Results  Component Value Date   HGBA1C 8.5 (H) 04/26/2022   MPG 197 04/26/2022    Cardiac Panel (last 3 results) No results for input(s): "CKTOTAL", "CKMB", "TROPONINIHS", "RELINDX" in the last 72 hours.    TSH Recent Labs    04/25/22 2014  TSH 5.554*    FOLLOW UP PLANS AND APPOINTMENTS Discharge Instructions     Diet - low sodium heart healthy   Complete by: As directed    Discharge wound care:   Complete by: As directed    As per AVS   Increase activity slowly   Complete by: As directed       Allergies as of 04/30/2022       Reactions   Lipitor [atorvastatin]    Cramps        Medication List     TAKE these medications    acetaminophen 500 MG tablet Commonly known as: TYLENOL Take 500 mg by mouth every 6 (six) hours as needed (joint  pain).   beta carotene w/minerals tablet Take 1 tablet by mouth daily.   FISH OIL PO Take by mouth 2 (two) times daily.   glimepiride 2 MG tablet Commonly known as: AMARYL Take 2 mg by mouth daily before breakfast.   losartan-hydrochlorothiazide 100-12.5 MG tablet Commonly known as: HYZAAR Take 1 tablet by mouth daily.   metformin 500 MG (OSM) 24 hr tablet Commonly known as: FORTAMET Take 500 mg by mouth every evening.   metoprolol succinate 25 MG 24 hr tablet Commonly known as: TOPROL-XL Take 1 tablet (25 mg total) by mouth daily.   multivitamin tablet Take 1 tablet by mouth daily.   simvastatin 40 MG tablet Commonly known  as: ZOCOR Take 40 mg by mouth every evening.               Discharge Care Instructions  (From admission, onward)           Start     Ordered   04/27/22 0000  Discharge wound care:       Comments: As per AVS   04/27/22 0944            Follow-up Information     [facility] Follow up.   Specialty: Cardiology Why: on 3/21 at 0920 for post pacemaker wound check. Contact information: [address] [place] [id number] [place] [phone]        Care, [facility] Follow up.   Specialty: Home Health Services Why: Physical Therapy,Occupational Therapy, Aide-office to call with visit times. Contact information: [address] [place] [address] [phone]         Rex Kras, DO. Go on 05/11/2022.   Specialties: Cardiology, Vascular Surgery Why: 2:30 PM Have labs done at [facility] 48 hours prior to the office visit. Bring the caridac medication bottles at the next office visit to help facilitate a more accurate medication reconciliation. Contact information: [address] [place] [address] [phone]                Total time spent: 37 minutes.    Rex Kras, MD, [facility] 04/30/2022, 9:41 AM Office:  [phone]

## 2022-04-27 NOTE — Progress Notes (Addendum)
  Patient Name: Scott Coleman Date of Encounter: 04/27/2022  Primary Cardiologist: None Electrophysiologist: Dr. Lovena Le  Interval Summary   S/p Abbott DDD PPM 04/26/2022  The patient is doing well today.  Slightly sore at [facility] site, but otherwise feeling better.   Inpatient Medications    Scheduled Meds:  aspirin EC  81 mg Oral Daily   Chlorhexidine Gluconate Cloth  6 each Topical Daily   insulin aspart  0-15 Units Subcutaneous Q4H   insulin detemir  5 Units Subcutaneous QHS   pantoprazole  40 mg Oral Daily   simvastatin  40 mg Oral QPM   Continuous Infusions:  sodium chloride Stopped (04/26/22 1034)    ceFAZolin (ANCEF) IV Stopped (04/27/22 0418)   PRN Meds: sodium chloride, acetaminophen, docusate sodium, ondansetron (ZOFRAN) IV, ondansetron (ZOFRAN) IV, mouth rinse, polyethylene glycol   Vital Signs    Vitals:   04/27/22 0500 04/27/22 0600 04/27/22 0700 04/27/22 0800  BP: 138/64 (!) 150/65 (!) 155/92 (!) 121/106  Pulse: 64 67 67 71  Resp: (!) 23 20 17 15   Temp:      TempSrc:      SpO2: 95% 95% 95% 96%  Weight: 70.6 kg     Height:        Intake/Output Summary (Last 24 hours) at 04/27/2022 0824 Last data filed at 04/27/2022 0500 Gross per 24 hour  Intake 764.19 ml  Output 1400 ml  Net -635.81 ml   Filed Weights   04/25/22 2228 04/26/22 0338 04/27/22 0500  Weight: 71.2 kg 73.1 kg 70.6 kg    Physical Exam    GEN- The patient is well appearing, alert and oriented x 3 today.   Lungs- Clear to ausculation bilaterally, normal work of breathing Cardiac- Regular rate and rhythm, no murmurs, rubs or gallops GI- soft, NT, ND, + BS Extremities- no clubbing or cyanosis. No edema  Telemetry    NSR/pacing 60-70s (personally reviewed)  Hospital Course    Scott Coleman is a 87 y.o. male with a history of CAD s/p CABG, DM2, HLD, HTN, h/o CVA, and h/o RCC who is being seen today for the evaluation of CHB at the request of Dr. Terri Skains.   Assessment & Plan     CHB S/p Abbott DDD PPM 3/11 by Dr. Lovena Le.  No immediate post op complications CXR looks good, formal read pending.  Arm restrictions and wound care reviewed, will re-review s/p CXR and with family.    CAD s/p CABG 2010 Denies s/s ischemia Echo 3/11 LVEF 45-50%   Leukocytosis Likely reactive. Resolved with pacing and IVF.   HTN Can add BB as needed now s/p pacing.   EP will see as needed while he remains here. Usual follow up made.   For questions or updates, please contact [place] Please consult [email or web address] for contact info under Cardiology/STEMI.  Signed, Shirley Friar, PA-C  04/27/2022, 8:24 AM   EP Attending  Patient seen and examined. Agree with above. The patient is doing well, s/p PPM insertion. Interrogation under my direction demonstrates normal DDD PM function. His CXR looks good. [name] for DC home with usual followup.   Carleene Overlie Mykale Gandolfo,MD

## 2022-04-27 NOTE — Telephone Encounter (Signed)
TOC call: Appt 05/11/2022  Patient is currently in the hospital.

## 2022-04-27 NOTE — Discharge Instructions (Signed)
After Your Pacemaker   You have a Abbott Pacemaker  ACTIVITY Do not lift your arm above shoulder height for 1 week after your procedure. After 7 days, you may progress as below.  You should remove your sling 24 hours after your procedure, unless otherwise instructed by your provider.     Tuesday May 04, 2022  Wednesday May 05, 2022 Thursday May 06, 2022 Friday May 07, 2022   Do not lift, push, pull, or carry anything over 10 pounds with the affected arm until 6 weeks (Tuesday June 08, 2022 ) after your procedure.   You may drive AFTER your wound check, unless you have been told otherwise by your provider.   Ask your healthcare provider when you can go back to work   INCISION/Dressing If you are on a blood thinner such as Coumadin, Xarelto, Eliquis, Plavix, or Pradaxa please confirm with your provider when this should be resumed.   If large square, outer bandage is left in place, this can be removed after 24 hours from your procedure. Do not remove steri-strips or glue as below.   If a PRESSURE DRESSING (a bulky dressing that usually goes up over your shoulder) was applied or left in place, please follow instructions given by your provider on when to return to have this removed.   Monitor your Pacemaker site for redness, swelling, and drainage. Call the device clinic at [phone] if you experience these symptoms or fever/chills.  If your incision is sealed with Steri-strips or staples, you may shower 7 days after your procedure or when told by your provider. Do not remove the steri-strips or let the shower hit directly on your site. You may wash around your site with soap and water.    If you were discharged in a sling, please do not wear this during the day more than 48 hours after your surgery unless otherwise instructed. This may increase the risk of stiffness and soreness in your shoulder.   Avoid lotions, ointments, or perfumes over your incision until it is  well-healed.  You may use a hot tub or a pool AFTER your wound check appointment if the incision is completely closed.  Pacemaker Alerts:  Some alerts are vibratory and others beep. These are NOT emergencies. Please call our office to let us know. If this occurs at night or on weekends, it can wait until the next business day. Send a remote transmission.  If your device is capable of reading fluid status (for heart failure), you will be offered monthly monitoring to review this with you.   DEVICE MANAGEMENT Remote monitoring is used to monitor your pacemaker from home. This monitoring is scheduled every 91 days by our office. It allows [place] to keep an eye on the functioning of your device to ensure it is working properly. You will routinely see your Electrophysiologist annually (more often if necessary).   You should receive your ID card for your new device in 4-8 weeks. Keep this card with you at all times once received. Consider wearing a medical alert bracelet or necklace.  Your Pacemaker may be MRI compatible. This will be discussed at your next office visit/wound check.  You should avoid contact with strong electric or magnetic fields.   Do not use amateur (ham) radio equipment or electric (arc) welding torches. MP3 player headphones with magnets should not be used. Some devices are safe to use if held at least 12 inches (30 cm) from your Pacemaker. These include power tools, lawn  mowers, and speakers. If you are unsure if something is safe to use, ask your health care provider.  When using your cell phone, hold it to the ear that is on the opposite side from the Pacemaker. Do not leave your cell phone in a pocket over the Pacemaker.  You may safely use electric blankets, heating pads, computers, and microwave ovens.  Call the office right away if: You have chest pain. You feel more short of breath than you have felt before. You feel more light-headed than you have felt before. Your  incision starts to open up.  This information is not intended to replace advice given to you by your health care provider. Make sure you discuss any questions you have with your health care provider.

## 2022-04-27 NOTE — Progress Notes (Signed)
   04/27/22 1431  Spiritual Encounters  Type of Visit Initial  Care provided to: Patient  Conversation partners present during encounter Nurse  Referral source Nurse (RN/NT/LPN)  OnCall Visit No  Spiritual Framework  Presenting Themes Other (comment)  Community/Connection Family  Regulatory affairs officer (For Healthcare)  Does Patient Have a Medical Advance Directive? No  Would patient like information on creating a medical advance directive? Yes (Inpatient - patient defers creating a medical advance directive at this time - Information given) (Daughter took forms home with her.)   Responded to call from "Lanelle Bal" on floor nurse stating patient wanted to complete advance directive. Visited patient's room and was advised that the patient's daughter took the form home with her and patient does not know the information to complete for with chaplain. Patient stated daughter will return tomorrow with the forms. Informed patient that daughter doesn't have to be present for paperwork to be processed. Advised patient that a chaplain will return tomorrow to collect and process paperwork.

## 2022-04-28 ENCOUNTER — Other Ambulatory Visit (HOSPITAL_COMMUNITY): Payer: Self-pay

## 2022-04-28 ENCOUNTER — Other Ambulatory Visit: Payer: Self-pay

## 2022-04-28 DIAGNOSIS — I2510 Atherosclerotic heart disease of native coronary artery without angina pectoris: Secondary | ICD-10-CM

## 2022-04-28 DIAGNOSIS — N179 Acute kidney failure, unspecified: Secondary | ICD-10-CM

## 2022-04-28 LAB — BASIC METABOLIC PANEL
Anion gap: 13 (ref 5–15)
BUN: 18 mg/dL (ref 8–23)
CO2: 23 mmol/L (ref 22–32)
Calcium: 9.4 mg/dL (ref 8.9–10.3)
Chloride: 100 mmol/L (ref 98–111)
Creatinine, Ser: 1.42 mg/dL — ABNORMAL HIGH (ref 0.61–1.24)
GFR, Estimated: 49 mL/min — ABNORMAL LOW (ref >60)
Glucose, Bld: 130 mg/dL — ABNORMAL HIGH (ref 70–99)
Potassium: 4 mmol/L (ref 3.5–5.1)
Sodium: 136 mmol/L (ref 135–145)

## 2022-04-28 LAB — CBC
HCT: 44.1 % (ref 39.0–52.0)
Hemoglobin: 15.3 g/dL (ref 13.0–17.0)
MCH: 31.5 pg (ref 26.0–34.0)
MCHC: 34.7 g/dL (ref 30.0–36.0)
MCV: 90.9 fL (ref 80.0–100.0)
Platelets: 202 10*3/uL (ref 150–400)
RBC: 4.85 MIL/uL (ref 4.22–5.81)
RDW: 12.6 % (ref 11.5–15.5)
WBC: 9.5 10*3/uL (ref 4.0–10.5)
nRBC: 0 % (ref 0.0–0.2)

## 2022-04-28 LAB — GLUCOSE, CAPILLARY
Glucose-Capillary: 132 mg/dL — ABNORMAL HIGH (ref 70–99)
Glucose-Capillary: 134 mg/dL — ABNORMAL HIGH (ref 70–99)
Glucose-Capillary: 188 mg/dL — ABNORMAL HIGH (ref 70–99)
Glucose-Capillary: 225 mg/dL — ABNORMAL HIGH (ref 70–99)
Glucose-Capillary: 84 mg/dL (ref 70–99)
Glucose-Capillary: 95 mg/dL (ref 70–99)

## 2022-04-28 MED ORDER — METOPROLOL SUCCINATE ER 25 MG PO TB24
25.0000 mg | ORAL_TABLET | Freq: Every day | ORAL | Status: DC
  Administered 2022-04-28 – 2022-04-30 (×3): 25 mg via ORAL
  Filled 2022-04-28 (×3): qty 1

## 2022-04-28 MED ORDER — METOPROLOL SUCCINATE ER 25 MG PO TB24
25.0000 mg | ORAL_TABLET | Freq: Every day | ORAL | 2 refills | Status: DC
  Filled 2022-04-28: qty 30, 30d supply, fill #0

## 2022-04-28 MED ORDER — INSULIN ASPART 100 UNIT/ML IJ SOLN
0.0000 [IU] | Freq: Three times a day (TID) | INTRAMUSCULAR | Status: DC
  Administered 2022-04-28: 2 [IU] via SUBCUTANEOUS
  Administered 2022-04-29: 5 [IU] via SUBCUTANEOUS
  Administered 2022-04-29 – 2022-04-30 (×3): 2 [IU] via SUBCUTANEOUS

## 2022-04-28 NOTE — Progress Notes (Addendum)
Subjective:  Sitting upright in chair. Asymptomatic. Discharge planning ongoing.   Daughter present at bedside.   Current Facility-Administered Medications:    0.9 %  sodium chloride infusion, , Intravenous, PRN, Evans Lance, MD, Stopped at 04/26/22 1034   acetaminophen (TYLENOL) tablet 325-650 mg, 325-650 mg, Oral, Q4H PRN, Evans Lance, MD, 650 mg at 04/28/22 [id number]   amLODipine (NORVASC) tablet 5 mg, 5 mg, Oral, Daily, Patwardhan, Manish J, MD, 5 mg at 04/28/22 1120   aspirin EC tablet 81 mg, 81 mg, Oral, Daily, Evans Lance, MD, 81 mg at 04/28/22 1120   Chlorhexidine Gluconate Cloth 2 % PADS 6 each, 6 each, Topical, Daily, Evans Lance, MD, 6 each at 04/28/22 1030   docusate sodium (COLACE) capsule 100 mg, 100 mg, Oral, BID PRN, Evans Lance, MD   insulin aspart (novoLOG) injection 0-15 Units, 0-15 Units, Subcutaneous, Q4H, Evans Lance, MD, 5 Units at 04/28/22 0818   insulin detemir (LEVEMIR) injection 5 Units, 5 Units, Subcutaneous, QHS, Evans Lance, MD, 5 Units at 04/27/22 2152   metoprolol succinate (TOPROL-XL) 24 hr tablet 25 mg, 25 mg, Oral, Daily, Breana Litts, DO, 25 mg at 04/28/22 1120   ondansetron (ZOFRAN) injection 4 mg, 4 mg, Intravenous, Q6H PRN, Evans Lance, MD   ondansetron [facility]) injection 4 mg, 4 mg, Intravenous, Q6H PRN, Evans Lance, MD   Oral care mouth rinse, 15 mL, Mouth Rinse, PRN, Evans Lance, MD   pantoprazole (PROTONIX) EC tablet 40 mg, 40 mg, Oral, Daily, Evans Lance, MD, 40 mg at 04/28/22 1120   polyethylene glycol (MIRALAX / GLYCOLAX) packet 17 g, 17 g, Oral, Daily PRN, Evans Lance, MD   simvastatin (ZOCOR) tablet 40 mg, 40 mg, Oral, QPM, Evans Lance, MD, 40 mg at 04/27/22 1709   Objective:  Vital Signs in the last 24 hours: Temp:  [97.6 F (36.4 C)-98.5 F (36.9 C)] 97.6 F (36.4 C) (03/13 0754) Pulse Rate:  [62-92] 64 (03/13 1100) Resp:  [6-27] 21 (03/13 1100) BP: (96-168)/(47-113) 127/93 (03/13  1100) SpO2:  [92 %-98 %] 95 % (03/13 1100) Weight:  [70.2 kg] 70.2 kg (03/13 0500)  Intake/Output from previous day: 03/12 0701 - 03/13 0700 In: 16 [P.O.:480; IV Piggyback:50] Out: [id number] [Urine:1775]  Physical Exam Vitals and nursing note reviewed.  Constitutional:      General: He is not in acute distress. Neck:     Vascular: No JVD.     Comments: Right carotid endarterectomy site is well-healed Cardiovascular:     Rate and Rhythm: Normal rate and regular rhythm.     Heart sounds: Normal heart sounds. No murmur heard.    Comments: Sternotomy site well-healed Pulmonary:     Effort: Pulmonary effort is normal.     Breath sounds: Normal breath sounds. No wheezing or rales.  Chest:     Comments: Pacemaker pocket with no hematoma Musculoskeletal:     Right lower leg: No edema.     Left lower leg: No edema.     Comments: Left arm in a sling.    Imaging/tests reviewed and independently interpreted:  CXR 04/27/2022: New pacing device in place with the leads in the right atrium and right ventricle. No pneumothorax. No acute disease. Aortic Atherosclerosis  Cardiac Studies:  Telemetry 04/27/2022: A sensed V paced rhythm  EKG 04/27/2022: A sensed V paced rhythm  Echocardiogram 04/26/2022:  1. Left ventricular ejection fraction, by estimation, is 45 to 50%. The  left  ventricle has mildly decreased function. There is mild left  ventricular hypertrophy. Left ventricular diastolic function could not be  evaluated. Left ventricular diastolic  function could not be evaluated due to paced rhythm. Mild septal  hypokinesis likely due to post-op septum.   2. Right ventricular systolic function is normal. The right ventricular  size is normal.   3. The mitral valve is degenerative. Mild mitral valve regurgitation. No  evidence of mitral stenosis.   4. The aortic valve is tricuspid. Aortic valve regurgitation is trivial.  Aortic valve sclerosis is present, with no evidence of aortic  valve  stenosis.   5. The inferior vena cava is normal in size with greater than 50%  respiratory variability, suggesting right atrial pressure of 3 mmHg.   6. Rhythm strip during this exam demonstrates a paced rhythm.   Comparison(s): No prior Echocardiogram.   LABORATORY DATA:    Latest Ref Rng & Units 04/28/2022    7:07 AM 04/27/2022    5:54 AM 04/26/2022    6:19 AM  CBC  WBC 4.0 - 10.5 K/uL 9.5  9.3  10.8   Hemoglobin 13.0 - 17.0 g/dL 15.3  14.1  13.5   Hematocrit 39.0 - 52.0 % 44.1  40.0  39.6   Platelets 150 - 400 K/uL 202  185  203        Latest Ref Rng & Units 04/28/2022    7:07 AM 04/27/2022    5:54 AM 04/26/2022    6:19 AM  CMP  Glucose 70 - 99 mg/dL 130  103  87   BUN 8 - 23 mg/dL 18  25  38   Creatinine 0.61 - 1.24 mg/dL 1.42  1.58  1.97   Sodium 135 - 145 mmol/L 136  135  140   Potassium 3.5 - 5.1 mmol/L 4.0  4.4  4.2   Chloride 98 - 111 mmol/L 100  105  105   CO2 22 - 32 mmol/L '23  20  23   '$ Calcium 8.9 - 10.3 mg/dL 9.4  8.7  9.0     Lipid Panel     Component Value Date/Time   CHOL 112 04/26/2022 0619   TRIG 127 04/26/2022 0619   HDL 33 (L) 04/26/2022 0619   CHOLHDL 3.4 04/26/2022 0619   VLDL 25 04/26/2022 0619   LDLCALC 54 04/26/2022 0619   LDLDIRECT 55 04/26/2022 0619    No components found for: "NTPROBNP" No results for input(s): "PROBNP" in the last 8760 hours. Recent Labs    04/25/22 2014  TSH 5.554*    BMP Recent Labs    04/26/22 0619 04/27/22 0554 04/28/22 0707  NA 140 135 136  K 4.2 4.4 4.0  CL 105 105 100  CO2 23 20* 23  GLUCOSE 87 103* 130*  BUN 38* 25* 18  CREATININE 1.97* 1.58* 1.42*  CALCIUM 9.0 8.7* 9.4  GFRNONAA 33* 43* 49*    HEMOGLOBIN A1C Lab Results  Component Value Date   HGBA1C 8.5 (H) 04/26/2022   MPG 197 04/26/2022    Cardiac Panel (last 3 results) Recent Labs    04/25/22 2014 04/26/22 0420  TROPONINIHS 34* 88*    CHOLESTEROL Recent Labs    04/26/22 0619  CHOL 112    Hepatic Function  Panel No results for input(s): "PROT", "ALBUMIN", "AST", "ALT", "ALKPHOS", "BILITOT", "BILIDIR", "IBILI" in the last 8760 hours.   Assessment & Recommendations:  87 y/o Caucasian male with hypertension, hyperlipidemia, type 2 DM, CAD s/p CABG 2012 (LIMA-LAD,  SVG-diag, seq SVG-OM2, OM4, SVG-RCA), h/o stroke, h/o carotid endarterectomys/p prostate cancer, h/o nephrectomy, with complete AV block and asystole, now s/p dual chamber PPM  Complete AV block: Underwent transvenous pacer 04/25/2022. Underwent permanent pacemaker implant 04/26/2022. Telemetry notes atrial sensed ventricular paced rhythm with frequent PVCs at times. Reemphasized importance of using the left arm sling  Hypertension: Within acceptable limits. Continue amlodipine. Start Toprol-XL 25 mg p.o. daily for PVC management Once renal function is back to baseline recommend restarting losartan/HCTZ at his home dose.  AKI: History of nephrectomy. Continues to improve. Baseline creatinine unknown. Secondary to compete AV block.  CAD: S/p CABG Troop elevation secondary to AV block Outpatient workup recommended. Echocardiogram results reviewed.   Continue aspirin 81 mg p.o. daily Lipids are well-controlled. A1c needs to be better controlled.  Type 2 DM: Improving. Have asked him to follow-up with his primary care doctor once discharged and or endocrinology for further guidance with regards to glycemic control. Consider outpatient addition of Jardiance and/or Farxiga Hemoglobin A1c 8.5.  Disposition: Appreciate OPT/PT input.  Was being discharged yesterday 04/27/2022; however, the time of discharge daughter voices concerns with not able to take care of him at home. Currently social work, case management, PT and OT working on Development worker, international aid.    With regards to coordination of care restarted case manager/social work/nursing staff less likely he will be discharged safely to SNF today.  Transfer orders placed to  either 3/6 E.  Total time spent: 35 minutes.  Rex Kras, [place], [facility]  Pager:  [phone] Office: [phone]

## 2022-04-28 NOTE — Progress Notes (Signed)
   04/28/22 1344  Spiritual Encounters  Type of Visit Follow up  Care provided to: Pt and family  Reason for visit Advance directives  OnCall Visit No   Visited patient's room to collect the completed Advance Directive, however the form was not left in the room as previously advised to have happen. Daughter took forms home again. Offer to complete a new one with patient however Patient unable to provide information. Called Patient's daughter to secure names and address, daughter said she did not have the information with her at the time but will return with the form on tomorrow at 8 a.m. Advised daughter to leave the form however daughter wishes to be present during signing. Advised daughter that she does not need to be present however she insist. Explained that a chaplain can secure the document on tomorrow however it will mostly not be as early as 8 a.m. as we will need to secure 2 witnesses and a notary which usually takes place after 9.   Daughter said she will leave the form this time but if patient says he doesn't understand or want to sign the form, call her so she can talk to him at that time.

## 2022-04-28 NOTE — Plan of Care (Signed)
  Problem: Activity: Goal: Risk for activity intolerance will decrease Outcome: Progressing   Problem: Coping: Goal: Level of anxiety will decrease Outcome: Progressing   Problem: Safety: Goal: Ability to remain free from injury will improve Outcome: Progressing   Problem: Coping: Goal: Ability to adjust to condition or change in health will improve Outcome: Progressing   

## 2022-04-28 NOTE — Telephone Encounter (Signed)
Patient still admitted in the hospital.

## 2022-04-28 NOTE — Progress Notes (Signed)
Physical Therapy Treatment Patient Details Name: Cross Goldbeck MRN: [id number] DOB: Nov 23, 1935 Today's Date: 04/28/2022   History of Present Illness 87 y.o. male admitted 3/10 with AMS and found to be in complete heart block. 3/11 PPM placed. PMHx: CAD s/p CABG, DM2, HLD, HTN, CVA    PT Comments    Pt pleasant with flat affect and able to state that he "can't use my left arm". Pt educated for PPM precautions and having trouble maintaining with basic transfers with cues for safety. Pt with slowly improving gait tolerance and transfers with continued need for min assist. Daughter present during session and reports she cannot take off work for 1-2 weeks to assist pt and would prefer ST-SNF. D/C plan updated, will continue to follow. Pt educated for HEP and gait.   HR 92-100 SPO2 95% RA   Recommendations for follow up therapy are one component of a multi-disciplinary discharge planning process, led by the attending physician.  Recommendations may be updated based on patient status, additional functional criteria and insurance authorization.  Follow Up Recommendations  Skilled nursing-short term rehab (<3 hours/day) Can patient physically be transported by private vehicle: Yes   Assistance Recommended at Discharge Frequent or constant Supervision/Assistance  Patient can return home with the following A little help with walking and/or transfers;A lot of help with bathing/dressing/bathroom;Assistance with cooking/housework;Assist for transportation;Help with stairs or ramp for entrance   Equipment Recommendations  None recommended by PT    Recommendations for Other Services       Precautions / Restrictions Precautions Precautions: Fall;ICD/Pacemaker Restrictions LUE Weight Bearing: Non weight bearing     Mobility  Bed Mobility Overal bed mobility: Needs Assistance Bed Mobility: Rolling, Sidelying to Sit Rolling: Min assist Sidelying to sit: Min assist       General bed  mobility comments: cues for sequence and precautions    Transfers Overall transfer level: Needs assistance   Transfers: Sit to/from Stand Sit to Stand: Min assist           General transfer comment: min assist to rise with min cues for LUE positioning and precautions    Ambulation/Gait Ambulation/Gait assistance: Min assist Gait Distance (Feet): 170 Feet Assistive device: Rolling walker (2 wheels) Gait Pattern/deviations: Step-through pattern, Decreased stride length   Gait velocity interpretation: <1.8 ft/sec, indicate of risk for recurrent falls   General Gait Details: pt with cues for proximity to RW to maintain precautions and cues to look up as pt maintains cervical flexion able to increase distance and speed this session   Stairs             Wheelchair Mobility    Modified Rankin (Stroke Patients Only)       Balance Overall balance assessment: Needs assistance, History of Falls Sitting-balance support: Feet supported, No upper extremity supported Sitting balance-Leahy Scale: Good     Standing balance support: Reliant on assistive device for balance, Bilateral upper extremity supported Standing balance-Leahy Scale: Poor Standing balance comment: Rw for standing with guarding for balance                            Cognition Arousal/Alertness: Awake/alert Behavior During Therapy: Flat affect Overall Cognitive Status: Impaired/Different from baseline Area of Impairment: Memory                     Memory: Decreased short-term memory, Decreased recall of precautions  Exercises General Exercises - Lower Extremity Long Arc Quad: AROM, Both, Seated, 20 reps Hip Flexion/Marching: AROM, Both, Seated, 20 reps    General Comments        Pertinent Vitals/Pain Pain Assessment Pain Assessment: No/denies pain    Home Living                          Prior Function            PT Goals (current  goals can now be found in the care plan section) Progress towards PT goals: Progressing toward goals    Frequency    Min 3X/week      PT Plan Discharge plan needs to be updated    Co-evaluation              AM-PAC PT "6 Clicks" Mobility   Outcome Measure  Help needed turning from your back to your side while in a flat bed without using bedrails?: A Little Help needed moving from lying on your back to sitting on the side of a flat bed without using bedrails?: A Little Help needed moving to and from a bed to a chair (including a wheelchair)?: A Little Help needed standing up from a chair using your arms (e.g., wheelchair or bedside chair)?: A Little Help needed to walk in hospital room?: A Little Help needed climbing 3-5 steps with a railing? : Total 6 Click Score: 16    End of Session Equipment Utilized During Treatment: Gait belt Activity Tolerance: Patient tolerated treatment well Patient left: in chair;with call bell/phone within reach;Other (comment);with chair alarm set (sling reapplied at rest) Nurse Communication: Mobility status;Precautions PT Visit Diagnosis: Other abnormalities of gait and mobility (R26.89);Muscle weakness (generalized) (M62.81)     Time: [id number] PT Time Calculation (min) (ACUTE ONLY): 23 min  Charges:  $Gait Training: 8-22 mins $Therapeutic Exercise: 8-22 mins                     Bayard Males, PT Acute Rehabilitation Services Office: [place] 04/28/2022, 9:32 AM

## 2022-04-28 NOTE — Plan of Care (Signed)
  Problem: Education: Goal: Knowledge of General Education information will improve Description: Including pain rating scale, medication(s)/side effects and non-pharmacologic comfort measures Outcome: Progressing   Problem: Health Behavior/Discharge Planning: Goal: Ability to manage health-related needs will improve Outcome: Progressing   Problem: Clinical Measurements: Goal: Will remain free from infection Outcome: Progressing   

## 2022-04-28 NOTE — TOC Initial Note (Signed)
Transition of Care [facility]) - Initial/Assessment Note    Patient Details  Name: Scott Coleman MRN: [id number] Date of Birth: 1935/06/17  [facility]) CM/SW Contact:    Milas Gain, [place] Phone Number: 04/28/2022, 3:40 PM  Clinical Narrative:                  CSW received consult for possible SNF placement at time of discharge. CSW spoke with patient at bedside regarding PT recommendation of SNF placement at time of discharge. Patient reports PTA he comes from home alone. Patient expressed understanding of PT recommendation and requested that CSW call his POA his daughter Harmon Pier regarding his dc plans. CSW called patients daughter Harmon Pier and LVM. CSW awaiting call back to discuss PT/OT recommendations for patient. No further questions reported at this time. CSW to continue to follow and assist with discharge planning needs.   Expected Discharge Plan: [place] Barriers to Discharge: Continued Medical Work up   Patient Goals and CMS Choice Patient states their goals for this hospitalization and ongoing recovery are:: to return home.   Choice offered to / list presented to : Adult Children (Ex-spouse was at the bedside with daughter.)      Expected Discharge Plan and Services In-house Referral: NA Discharge Planning Services: CM Consult Post Acute Care Choice: [place] arrangements for the past 2 months: Single Family Home Expected Discharge Date: 04/27/22                         HH Arranged: PT, OT, Nurse's Aide HH Agency: [place] Date [facility] Agency Contacted: 04/27/22 Time HH Agency Contacted: 27 Representative spoke with at [place]: Tommi Rumps  Prior Living Arrangements/Services Living arrangements for the past 2 months: [place] with:: Self Patient language and need for interpreter reviewed:: Yes Do you feel safe going back to the place where you live?: Yes      Need for Family Participation in Patient Care:  Yes (Comment) Care giver support system in place?: Yes (comment)   Criminal Activity/Legal Involvement Pertinent to Current Situation/Hospitalization: No - Comment as needed  Activities of Daily Living Home Assistive Devices/Equipment: Walker (specify type) ADL Screening (condition at time of admission) Patient's cognitive ability adequate to safely complete daily activities?: Yes Is the patient deaf or have difficulty hearing?: No Does the patient have difficulty seeing, even when wearing glasses/contacts?: No Does the patient have difficulty concentrating, remembering, or making decisions?: No Patient able to express need for assistance with ADLs?: Yes Does the patient have difficulty dressing or bathing?: No Independently performs ADLs?: Yes (appropriate for developmental age) Does the patient have difficulty walking or climbing stairs?: Yes Weakness of Legs: Both Weakness of Arms/Hands: None  Permission Sought/Granted Permission sought to share information with : Family Supports, Customer service manager, Case Optician, dispensing granted to share information with : Yes, Verbal Permission Granted     Permission granted to share info w AGENCY: [place]        Emotional Assessment Appearance:: Appears stated age Attitude/Demeanor/Rapport: Unable to Assess Affect (typically observed): Unable to Assess Orientation: : Oriented to Self Alcohol / Substance Use: Not Applicable Psych Involvement: No (comment)  Admission diagnosis:  Complete heart block [facility]) [I44.2] Patient Active Problem List   Diagnosis Date Noted   AKI (acute kidney injury) ([place]) 04/28/2022   Encounter for care of pacemaker 04/27/2022   Leukocytosis 04/26/2022   Renal insufficiency 04/26/2022  Pacemaker [employer] - [id number] dual-chamber pacemaker 04/26/2022 04/26/2022   Heart block AV complete ([place]) 04/25/2022   Coronary artery disease involving native coronary artery of native heart without  angina pectoris 04/25/2022   Hx of CABG 04/25/2022   Mixed hyperlipidemia 04/25/2022   Benign hypertension 04/25/2022   History of nephrectomy, right 04/25/2022   History of carotid endarterectomy 04/25/2022   Bilateral carotid artery stenosis 04/25/2022   Chewing tobacco use 04/25/2022   PCP:  Orpah Melter, MD Pharmacy:   [facility] [address] [place] [address] Phone: [phone] Fax: [phone]     Social Determinants of Health (SDOH) Social History: SDOH Screenings   Food Insecurity: No Food Insecurity (04/25/2022)  Housing: Low Risk  (04/25/2022)  Transportation Needs: No Transportation Needs (04/25/2022)  Utilities: Not At Risk (04/25/2022)  Tobacco Use: Unknown (04/27/2022)   SDOH Interventions:     Readmission Risk Interventions     No data to display

## 2022-04-29 LAB — CBC
HCT: 40.3 % (ref 39.0–52.0)
Hemoglobin: 14.1 g/dL (ref 13.0–17.0)
MCH: 31.8 pg (ref 26.0–34.0)
MCHC: 35 g/dL (ref 30.0–36.0)
MCV: 90.8 fL (ref 80.0–100.0)
Platelets: 177 10*3/uL (ref 150–400)
RBC: 4.44 MIL/uL (ref 4.22–5.81)
RDW: 12.6 % (ref 11.5–15.5)
WBC: 8 10*3/uL (ref 4.0–10.5)
nRBC: 0 % (ref 0.0–0.2)

## 2022-04-29 LAB — BASIC METABOLIC PANEL
Anion gap: 8 (ref 5–15)
BUN: 23 mg/dL (ref 8–23)
CO2: 22 mmol/L (ref 22–32)
Calcium: 8.9 mg/dL (ref 8.9–10.3)
Chloride: 105 mmol/L (ref 98–111)
Creatinine, Ser: 1.51 mg/dL — ABNORMAL HIGH (ref 0.61–1.24)
GFR, Estimated: 45 mL/min — ABNORMAL LOW (ref >60)
Glucose, Bld: 138 mg/dL — ABNORMAL HIGH (ref 70–99)
Potassium: 4.4 mmol/L (ref 3.5–5.1)
Sodium: 135 mmol/L (ref 135–145)

## 2022-04-29 LAB — GLUCOSE, CAPILLARY
Glucose-Capillary: 124 mg/dL — ABNORMAL HIGH (ref 70–99)
Glucose-Capillary: 213 mg/dL — ABNORMAL HIGH (ref 70–99)
Glucose-Capillary: 147 mg/dL — ABNORMAL HIGH (ref 70–99)
Glucose-Capillary: 140 mg/dL — ABNORMAL HIGH (ref 70–99)

## 2022-04-29 MED ORDER — SODIUM CHLORIDE 0.9 % IV SOLN: INTRAVENOUS | Status: AC

## 2022-04-29 NOTE — TOC Progression Note (Signed)
Transition of Care [facility]) - Progression Note    Patient Details  Name: Scott Coleman MRN: [id number] Date of Birth: [birth year]  Transition of Care [facility]) CM/SW [place], LCSW Phone Number: 04/29/2022, 3:52 PM  Clinical Narrative:    CSW met with pt at bedside and called POA to discuss list of bed offers. Pt accepted bed [facility]. CSW reached out to [employer] to learn of earliest availability. SNF will have a bed available for pt tomorrow.   CSW started insurance auth. TOC will continue to follow this admission.    Expected Discharge Plan: [place] Barriers to Discharge: Continued Medical Work up  Expected Discharge Plan and Services In-house Referral: NA Discharge Planning Services: CM Consult Post Acute Care Choice: [place] arrangements for the past 2 months: Single Family Home Expected Discharge Date: 04/27/22                         HH Arranged: PT, OT, Nurse's Aide HH Agency: [place] Date [facility] Agency Contacted: 04/27/22 Time [place]: 38 Representative spoke with at [place]: [place] Determinants of Health ([place]) Interventions [place]: No Food Insecurity (04/25/2022)  Housing: Low Risk  (04/25/2022)  Transportation Needs: No Transportation Needs (04/25/2022)  Utilities: Not At Risk (04/25/2022)  Tobacco Use: Unknown (04/27/2022)    Readmission Risk Interventions     No data to display         Beckey Rutter, MSW, LCSWA, LCASA Transitions of Care  Clinical Social Worker I

## 2022-04-29 NOTE — Progress Notes (Addendum)
Subjective:  No events overnight. Transferred out of CVICU to 3 E. Denies chest pain or shortness of breath. Family chose not to have him discharged home now requesting SNF   Current Facility-Administered Medications:    0.9 %  sodium chloride infusion, , Intravenous, PRN, Evans Lance, MD, Stopped at 04/26/22 1034   0.9 %  sodium chloride infusion, , Intravenous, Continuous, Narmeen Kerper, DO   acetaminophen (TYLENOL) tablet 325-650 mg, 325-650 mg, Oral, Q4H PRN, Evans Lance, MD, 650 mg at 04/28/22 2244   amLODipine (NORVASC) tablet 5 mg, 5 mg, Oral, Daily, Patwardhan, Manish J, MD, 5 mg at 04/29/22 [id number]   aspirin EC tablet 81 mg, 81 mg, Oral, Daily, Evans Lance, MD, 81 mg at 04/29/22 [id number]   Chlorhexidine Gluconate Cloth 2 % PADS 6 each, 6 each, Topical, Daily, Evans Lance, MD, 6 each at 04/28/22 1030   docusate sodium (COLACE) capsule 100 mg, 100 mg, Oral, BID PRN, Evans Lance, MD   insulin aspart (novoLOG) injection 0-15 Units, 0-15 Units, Subcutaneous, TID WC, Keeana Pieratt, DO, 2 Units at 04/29/22 1715   insulin detemir (LEVEMIR) injection 5 Units, 5 Units, Subcutaneous, QHS, Evans Lance, MD, 5 Units at 04/29/22 0041   metoprolol succinate (TOPROL-XL) 24 hr tablet 25 mg, 25 mg, Oral, Daily, Nakia Remmers, DO, 25 mg at 04/29/22 0819   ondansetron (ZOFRAN) injection 4 mg, 4 mg, Intravenous, Q6H PRN, Evans Lance, MD   ondansetron [facility]) injection 4 mg, 4 mg, Intravenous, Q6H PRN, Evans Lance, MD   Oral care mouth rinse, 15 mL, Mouth Rinse, PRN, Evans Lance, MD   pantoprazole (PROTONIX) EC tablet 40 mg, 40 mg, Oral, Daily, Evans Lance, MD, 40 mg at 04/29/22 0819   polyethylene glycol (MIRALAX / GLYCOLAX) packet 17 g, 17 g, Oral, Daily PRN, Evans Lance, MD   simvastatin (ZOCOR) tablet 40 mg, 40 mg, Oral, QPM, Evans Lance, MD, 40 mg at 04/29/22 1715   Objective:  Vital Signs in the last 24 hours: Temp:  [97.6 F (36.4 C)-97.8 F (36.6 C)]  97.6 F (36.4 C) (03/14 1126) Pulse Rate:  [60-72] 60 (03/14 1126) Resp:  [16-20] 18 (03/14 1126) BP: (113-148)/(58-82) 136/58 (03/14 1126) SpO2:  [96 %-98 %] 97 % (03/14 1126) Weight:  [72.6 kg] 72.6 kg (03/14 0042)  Intake/Output from previous day: 03/13 0701 - 03/14 0700 In: 44 [P.O.:720] Out: 1200 [Urine:1200]  Physical Exam Vitals and nursing note reviewed.  Constitutional:      General: He is not in acute distress. Neck:     Vascular: No JVD.     Comments: Right carotid endarterectomy site is well-healed Cardiovascular:     Rate and Rhythm: Normal rate and regular rhythm.     Heart sounds: Normal heart sounds. No murmur heard.    Comments: Sternotomy site well-healed Pulmonary:     Effort: Pulmonary effort is normal.     Breath sounds: Normal breath sounds. No wheezing or rales.  Chest:     Comments: Pacemaker pocket with no hematoma Musculoskeletal:     Right lower leg: No edema.     Left lower leg: No edema.     Comments: Left arm in a sling.    Imaging/tests reviewed and independently interpreted:  CXR 04/27/2022: New pacing device in place with the leads in the right atrium and right ventricle. No pneumothorax. No acute disease. Aortic Atherosclerosis  Cardiac Studies:  Telemetry 04/27/2022: A sensed V paced rhythm  EKG 04/27/2022: A sensed V paced rhythm  Echocardiogram 04/26/2022:  1. Left ventricular ejection fraction, by estimation, is 45 to 50%. The  left ventricle has mildly decreased function. There is mild left  ventricular hypertrophy. Left ventricular diastolic function could not be  evaluated. Left ventricular diastolic  function could not be evaluated due to paced rhythm. Mild septal  hypokinesis likely due to post-op septum.   2. Right ventricular systolic function is normal. The right ventricular  size is normal.   3. The mitral valve is degenerative. Mild mitral valve regurgitation. No  evidence of mitral stenosis.   4. The aortic  valve is tricuspid. Aortic valve regurgitation is trivial.  Aortic valve sclerosis is present, with no evidence of aortic valve  stenosis.   5. The inferior vena cava is normal in size with greater than 50%  respiratory variability, suggesting right atrial pressure of 3 mmHg.   6. Rhythm strip during this exam demonstrates a paced rhythm.   Comparison(s): No prior Echocardiogram.   LABORATORY DATA:    Latest Ref Rng & Units 04/29/2022   12:42 AM 04/28/2022    7:07 AM 04/27/2022    5:54 AM  CBC  WBC 4.0 - 10.5 K/uL 8.0  9.5  9.3   Hemoglobin 13.0 - 17.0 g/dL 14.1  15.3  14.1   Hematocrit 39.0 - 52.0 % 40.3  44.1  40.0   Platelets 150 - 400 K/uL 177  202  185        Latest Ref Rng & Units 04/29/2022   12:42 AM 04/28/2022    7:07 AM 04/27/2022    5:54 AM  CMP  Glucose 70 - 99 mg/dL 138  130  103   BUN 8 - 23 mg/dL '23  18  25   '$ Creatinine 0.61 - 1.24 mg/dL 1.51  1.42  1.58   Sodium 135 - 145 mmol/L 135  136  135   Potassium 3.5 - 5.1 mmol/L 4.4  4.0  4.4   Chloride 98 - 111 mmol/L 105  100  105   CO2 22 - 32 mmol/L '22  23  20   '$ Calcium 8.9 - 10.3 mg/dL 8.9  9.4  8.7     Lipid Panel     Component Value Date/Time   CHOL 112 04/26/2022 0619   TRIG 127 04/26/2022 0619   HDL 33 (L) 04/26/2022 0619   CHOLHDL 3.4 04/26/2022 0619   VLDL 25 04/26/2022 0619   LDLCALC 54 04/26/2022 0619   LDLDIRECT 55 04/26/2022 0619    No components found for: "NTPROBNP" No results for input(s): "PROBNP" in the last 8760 hours. Recent Labs    04/25/22 2014  TSH 5.554*    BMP Recent Labs    04/27/22 0554 04/28/22 0707 04/29/22 0042  NA 135 136 135  K 4.4 4.0 4.4  CL 105 100 105  CO2 20* 23 22  GLUCOSE 103* 130* 138*  BUN 25* 18 23  CREATININE 1.58* 1.42* 1.51*  CALCIUM 8.7* 9.4 8.9  GFRNONAA 43* 49* 45*    HEMOGLOBIN A1C Lab Results  Component Value Date   HGBA1C 8.5 (H) 04/26/2022   MPG 197 04/26/2022    Cardiac Panel (last 3 results) No results for input(s): "CKTOTAL",  "CKMB", "TROPONINIHS", "RELINDX" in the last 72 hours.   CHOLESTEROL Recent Labs    04/26/22 0619  CHOL 112    Hepatic Function Panel No results for input(s): "PROT", "ALBUMIN", "AST", "ALT", "ALKPHOS", "BILITOT", "BILIDIR", "IBILI" in the last 8760  hours.   Assessment & Recommendations:  87 y/o Caucasian male with hypertension, hyperlipidemia, type 2 DM, CAD s/p CABG 2012 (LIMA-LAD, SVG-diag, seq SVG-OM2, OM4, SVG-RCA), h/o stroke, h/o carotid endarterectomys/p prostate cancer, h/o nephrectomy, with complete AV block and asystole, now s/p dual chamber PPM  Complete AV block: Underwent transvenous pacer 04/25/2022. Underwent permanent pacemaker implant 04/26/2022. Telemetry notes atrial sensed ventricular paced rhythm. Reemphasized importance of using the left arm sling  Hypertension: Within acceptable limits. Continue amlodipine. Start Toprol-XL 25 mg p.o. daily for PVC management Once renal function is back to baseline recommend restarting losartan/HCTZ at his home dose.  AKI: History of nephrectomy. Morning creatinine slightly trending up. Will start 0.9 normal saline for 6 hours Baseline creatinine unknown. Secondary to compete AV block.  CAD: S/p CABG Trop elevation secondary to AV block Outpatient workup recommended. Echocardiogram results reviewed.   Continue aspirin 81 mg p.o. daily Lipids are well-controlled. A1c needs to be better controlled.  Type 2 DM: Improving. Have asked him to follow-up with his primary care doctor once discharged and or endocrinology for further guidance with regards to glycemic control. Consider outpatient addition of Jardiance and/or Farxiga Hemoglobin A1c 8.5.  Disposition: Appreciate OPT/PT input.  Was being discharged yesterday 04/27/2022; however, the time of discharge daughter voices concerns with not able to take care of him at home. Currently social work, case management, PT and OT working on Development worker, international aid.   As of  today patient is cleared for discharge Barriers to Discharge: Awaiting placement. Time spent coordinating his care between case manager, social work, Engineer, civil (consulting), and patient's family  Total time spent: 35 minutes.  Rex Kras, [place], [facility]  Pager:  [phone] Office: [phone]

## 2022-04-29 NOTE — Telephone Encounter (Signed)
Patient is still admitted in the hospital.  

## 2022-04-29 NOTE — Progress Notes (Signed)
   04/29/22 1158  Spiritual Encounters  Type of Visit Follow up  Care provided to: Pt and family  Conversation partners present during encounter Nurse  Referral source Family  Reason for visit Advance directives  OnCall Visit No  Advance Directives (For Healthcare)  Does Patient Have a Medical Advance Directive? Yes  Does patient want to make changes to medical advance directive? Yes (ED - send information to MyChart)  Type of Advance Directive [place] in Chart? Yes - validated most recent copy scanned in chart (See row information)   Visited patient and family, advance directive accepted, scanned , notarized and copy given to patient and family.

## 2022-04-29 NOTE — Progress Notes (Signed)
Patient's daughter would like EP team to call with discharge instructions about pacemaker care and what do with the transmitter. RN unable to page EP team currently. Will relay to nursing staff to notify EP prior to discharge to answer daughters question about pacemaker. Patient would like the attending MD to call with update on medication changes prior to discharge and patient's daughter has questions for LCSW about outpatient rehab follow up. RN is unable to possible medication changes, there is no discharge summary. RN relayed to daughter to come to bedside to meet MD as they round. Daughter verbalize understanding.

## 2022-04-29 NOTE — NC FL2 (Signed)
[place] LEVEL OF CARE FORM     IDENTIFICATION  Patient Name: Scott Coleman Birthdate: 01-23-1936 Sex: male Admission Date (Current Location): 04/25/2022  [facility] and [place] Number:  Herbalist and Address:  [facility], [place] [address], [place], [place] [address]      Provider Number: [id number]  Attending Physician Name and Address:  Rex Kras, DO  Relative Name and Phone Number:       Current Level of Care: Hospital Recommended Level of Care: [place] Prior Approval Number:    Date Approved/Denied:   PASRR Number: [id number]  Discharge Plan: SNF    Current Diagnoses: Patient Active Problem List   Diagnosis Date Noted   AKI (acute kidney injury) ([place]) 04/28/2022   Encounter for care of pacemaker 04/27/2022   Leukocytosis 04/26/2022   Renal insufficiency 04/26/2022   Pacemaker [employer] - [id number] dual-chamber pacemaker 04/26/2022 04/26/2022   Heart block AV complete ([place]) 04/25/2022   Coronary artery disease involving native coronary artery of native heart without angina pectoris 04/25/2022   Hx of CABG 04/25/2022   Mixed hyperlipidemia 04/25/2022   Benign hypertension 04/25/2022   History of nephrectomy, right 04/25/2022   History of carotid endarterectomy 04/25/2022   Bilateral carotid artery stenosis 04/25/2022   Chewing tobacco use 04/25/2022    Orientation RESPIRATION BLADDER Height & Weight     Self, Time, Situation, Place  Normal Continent Weight: 160 lb 0.9 oz (72.6 kg) Height:  '5\' 7"'$  (170.2 cm)  BEHAVIORAL SYMPTOMS/MOOD NEUROLOGICAL BOWEL NUTRITION STATUS      Continent Diet (See dc summary)  AMBULATORY STATUS COMMUNICATION OF NEEDS Skin   Limited Assist Verbally Normal                       Personal Care Assistance Level of Assistance  Bathing, Feeding, Dressing Bathing Assistance: Maximum assistance Feeding assistance: Independent Dressing Assistance:  Maximum assistance     Functional Limitations Info  Sight, Hearing, Speech Sight Info: Impaired Hearing Info: Adequate Speech Info: Adequate    SPECIAL CARE FACTORS FREQUENCY  PT (By licensed PT), OT (By licensed OT)     PT Frequency: 5xweek OT Frequency: 5xweek            Contractures Contractures Info: Not present    Additional Factors Info  Code Status, Allergies Code Status Info: Full Allergies Info: Lipitor (Atorvastatin)           Current Medications (04/29/2022):  This is the current hospital active medication list Current Facility-Administered Medications  Medication Dose Route Frequency Provider Last Rate Last Admin   0.9 %  sodium chloride infusion   Intravenous PRN Evans Lance, MD   Stopped at 04/26/22 1034   acetaminophen (TYLENOL) tablet 325-650 mg  325-650 mg Oral Q4H PRN Evans Lance, MD   650 mg at 04/28/22 2244   amLODipine (NORVASC) tablet 5 mg  5 mg Oral Daily Patwardhan, Manish J, MD   5 mg at 04/29/22 0819   aspirin EC tablet 81 mg  81 mg Oral Daily Evans Lance, MD   81 mg at 04/29/22 [id number]   Chlorhexidine Gluconate Cloth 2 % PADS 6 each  6 each Topical Daily Evans Lance, MD   6 each at 04/28/22 1030   docusate sodium (COLACE) capsule 100 mg  100 mg Oral BID PRN Evans Lance, MD       insulin aspart (novoLOG) injection 0-15  Units  0-15 Units Subcutaneous TID WC Tolia, Sunit, DO   2 Units at 04/29/22 0647   insulin detemir (LEVEMIR) injection 5 Units  5 Units Subcutaneous QHS Evans Lance, MD   5 Units at 04/29/22 0041   metoprolol succinate (TOPROL-XL) 24 hr tablet 25 mg  25 mg Oral Daily Tolia, Sunit, DO   25 mg at 04/29/22 0819   ondansetron (ZOFRAN) injection 4 mg  4 mg Intravenous Q6H PRN Evans Lance, MD       ondansetron [facility]) injection 4 mg  4 mg Intravenous Q6H PRN Evans Lance, MD       Oral care mouth rinse  15 mL Mouth Rinse PRN Evans Lance, MD       pantoprazole (PROTONIX) EC tablet 40 mg  40 mg Oral Daily  Evans Lance, MD   40 mg at 04/29/22 0819   polyethylene glycol (MIRALAX / GLYCOLAX) packet 17 g  17 g Oral Daily PRN Evans Lance, MD       simvastatin (ZOCOR) tablet 40 mg  40 mg Oral QPM Evans Lance, MD   40 mg at 04/28/22 1633     Discharge Medications: Please see discharge summary for a list of discharge medications.  Relevant Imaging Results:  Relevant Lab Results:   Additional Information SSN: [id number]  Beckey Rutter, MSW, [place] Transitions of Care  Clinical Social Worker I

## 2022-04-29 NOTE — Care Management Important Message (Signed)
Important Message  Patient Details  Name: Scott Coleman MRN: [id number] Date of Birth: Oct 19, 1935   Medicare Important Message Given:  Yes     Shelda Altes 04/29/2022, 8:42 AM

## 2022-04-29 NOTE — Plan of Care (Signed)
  Problem: Health Behavior/Discharge Planning: Goal: Ability to manage health-related needs will improve Outcome: Progressing   Problem: Clinical Measurements: Goal: Ability to maintain clinical measurements within normal limits will improve Outcome: Progressing   Problem: Nutrition: Goal: Adequate nutrition will be maintained Outcome: Progressing   

## 2022-04-30 DIAGNOSIS — E785 Hyperlipidemia, unspecified: Secondary | ICD-10-CM | POA: Diagnosis not present

## 2022-04-30 DIAGNOSIS — Z9889 Other specified postprocedural states: Secondary | ICD-10-CM | POA: Diagnosis not present

## 2022-04-30 DIAGNOSIS — Z48812 Encounter for surgical aftercare following surgery on the circulatory system: Secondary | ICD-10-CM | POA: Diagnosis not present

## 2022-04-30 DIAGNOSIS — E119 Type 2 diabetes mellitus without complications: Secondary | ICD-10-CM | POA: Diagnosis not present

## 2022-04-30 DIAGNOSIS — I6523 Occlusion and stenosis of bilateral carotid arteries: Secondary | ICD-10-CM | POA: Diagnosis not present

## 2022-04-30 DIAGNOSIS — R41841 Cognitive communication deficit: Secondary | ICD-10-CM | POA: Diagnosis not present

## 2022-04-30 DIAGNOSIS — Z905 Acquired absence of kidney: Secondary | ICD-10-CM | POA: Diagnosis not present

## 2022-04-30 DIAGNOSIS — Z720 Tobacco use: Secondary | ICD-10-CM | POA: Diagnosis not present

## 2022-04-30 DIAGNOSIS — R1312 Dysphagia, oropharyngeal phase: Secondary | ICD-10-CM | POA: Diagnosis not present

## 2022-04-30 DIAGNOSIS — I6529 Occlusion and stenosis of unspecified carotid artery: Secondary | ICD-10-CM | POA: Diagnosis not present

## 2022-04-30 DIAGNOSIS — E118 Type 2 diabetes mellitus with unspecified complications: Secondary | ICD-10-CM | POA: Diagnosis not present

## 2022-04-30 DIAGNOSIS — M6281 Muscle weakness (generalized): Secondary | ICD-10-CM | POA: Diagnosis not present

## 2022-04-30 DIAGNOSIS — Z951 Presence of aortocoronary bypass graft: Secondary | ICD-10-CM | POA: Diagnosis not present

## 2022-04-30 DIAGNOSIS — E782 Mixed hyperlipidemia: Secondary | ICD-10-CM | POA: Diagnosis not present

## 2022-04-30 DIAGNOSIS — N183 Chronic kidney disease, stage 3 unspecified: Secondary | ICD-10-CM | POA: Diagnosis not present

## 2022-04-30 DIAGNOSIS — I501 Left ventricular failure: Secondary | ICD-10-CM | POA: Diagnosis not present

## 2022-04-30 DIAGNOSIS — I2510 Atherosclerotic heart disease of native coronary artery without angina pectoris: Secondary | ICD-10-CM | POA: Diagnosis not present

## 2022-04-30 DIAGNOSIS — I442 Atrioventricular block, complete: Secondary | ICD-10-CM | POA: Diagnosis not present

## 2022-04-30 DIAGNOSIS — R2689 Other abnormalities of gait and mobility: Secondary | ICD-10-CM | POA: Diagnosis not present

## 2022-04-30 DIAGNOSIS — I10 Essential (primary) hypertension: Secondary | ICD-10-CM | POA: Diagnosis not present

## 2022-04-30 DIAGNOSIS — I429 Cardiomyopathy, unspecified: Secondary | ICD-10-CM | POA: Diagnosis not present

## 2022-04-30 DIAGNOSIS — R9431 Abnormal electrocardiogram [ECG] [EKG]: Secondary | ICD-10-CM | POA: Diagnosis not present

## 2022-04-30 DIAGNOSIS — Z950 Presence of cardiac pacemaker: Secondary | ICD-10-CM | POA: Diagnosis not present

## 2022-04-30 LAB — BASIC METABOLIC PANEL
Anion gap: 7 (ref 5–15)
BUN: 29 mg/dL — ABNORMAL HIGH (ref 8–23)
CO2: 24 mmol/L (ref 22–32)
Calcium: 8.6 mg/dL — ABNORMAL LOW (ref 8.9–10.3)
Chloride: 104 mmol/L (ref 98–111)
Creatinine, Ser: 1.58 mg/dL — ABNORMAL HIGH (ref 0.61–1.24)
GFR, Estimated: 43 mL/min — ABNORMAL LOW (ref >60)
Glucose, Bld: 131 mg/dL — ABNORMAL HIGH (ref 70–99)
Potassium: 4.2 mmol/L (ref 3.5–5.1)
Sodium: 135 mmol/L (ref 135–145)

## 2022-04-30 LAB — CBC
HCT: 39.7 % (ref 39.0–52.0)
Hemoglobin: 13.5 g/dL (ref 13.0–17.0)
MCH: 31.6 pg (ref 26.0–34.0)
MCHC: 34 g/dL (ref 30.0–36.0)
MCV: 93 fL (ref 80.0–100.0)
Platelets: 187 10*3/uL (ref 150–400)
RBC: 4.27 MIL/uL (ref 4.22–5.81)
RDW: 12.7 % (ref 11.5–15.5)
WBC: 7.5 10*3/uL (ref 4.0–10.5)
nRBC: 0 % (ref 0.0–0.2)

## 2022-04-30 LAB — GLUCOSE, CAPILLARY: Glucose-Capillary: 122 mg/dL — ABNORMAL HIGH (ref 70–99)

## 2022-04-30 NOTE — Telephone Encounter (Signed)
Patient still admitted to the hospital.

## 2022-04-30 NOTE — Telephone Encounter (Signed)
Patient was released this afternoon (04/30/22) from the hospital. The [place] call will be done Monday.

## 2022-04-30 NOTE — Progress Notes (Signed)
Occupational Therapy Treatment Patient Details Name: Scott Coleman MRN: [id number] DOB: 1935-05-23 Today's Date: 04/30/2022   History of present illness 87 y.o. male admitted 3/10 with AMS and found to be in complete heart block. 3/11 PPM placed. PMHx: CAD s/p CABG, DM2, HLD, HTN, CVA   OT comments  Patient with good progress toward patient focused goals.  Patient beginning to move better with less shoulder discomfort, and demos Min A for sit to stand and [employer] for in room mobility.  Patient still needing closer to Mod A for lower body ADL, but again, beginning to demonstrate improved function.  OT continues to recommend a more aggressive rehab post acute than home health.  OT continues to be indicated to address deficits and maximize functional independence.     Recommendations for follow up therapy are one component of a multi-disciplinary discharge planning process, led by the attending physician.  Recommendations may be updated based on patient status, additional functional criteria and insurance authorization.    Follow Up Recommendations  Skilled nursing-short term rehab (<3 hours/day)     Assistance Recommended at Discharge Frequent or constant Supervision/Assistance  Patient can return home with the following  Help with stairs or ramp for entrance;Assist for transportation;Assistance with cooking/housework;A lot of help with bathing/dressing/bathroom;A lot of help with walking and/or transfers   Equipment Recommendations  None recommended by OT    Recommendations for Other Services      Precautions / Restrictions Precautions Precautions: Fall;ICD/Pacemaker Restrictions Other Position/Activity Restrictions: limit pushing/pulling to L UE and AROM to 90 degrees L UE.       Mobility Bed Mobility   Bed Mobility: Supine to Sit     Supine to sit: Supervision, HOB elevated          Transfers Overall transfer level: Needs assistance   Transfers: Sit to/from Stand,  Bed to chair/wheelchair/BSC Sit to Stand: Min assist     Step pivot transfers: Min guard           Balance Overall balance assessment: Needs assistance, History of Falls Sitting-balance support: Feet supported, No upper extremity supported Sitting balance-Leahy Scale: Good     Standing balance support: Reliant on assistive device for balance Standing balance-Leahy Scale: Poor Standing balance comment: Narrow BOS                           ADL either performed or assessed with clinical judgement   ADL   Eating/Feeding: Set up;Sitting   Grooming: Wash/dry hands;Wash/dry face;Oral care;Set up;Sitting           Upper Body Dressing : Minimal assistance;Sitting   Lower Body Dressing: Moderate assistance;Sit to/from stand   Toilet Transfer: Min guard;Rolling walker (2 wheels);Ambulation;Regular Toilet                  Extremity/Trunk Assessment Upper Extremity Assessment Upper Extremity Assessment: LUE deficits/detail LUE Deficits / Details: AROM below 90 degress LUE Sensation: WNL LUE Coordination: WNL       Cervical / Trunk Assessment Cervical / Trunk Assessment: Kyphotic    Vision Patient Visual Report: No change from baseline     Perception Perception Perception: Not tested   Praxis Praxis Praxis: Not tested    Cognition Arousal/Alertness: Awake/alert Behavior During Therapy: WFL for tasks assessed/performed Overall Cognitive Status: Within Functional Limits for tasks assessed Area of Impairment: Safety/judgement  Safety/Judgement: Decreased awareness of safety     General Comments: Patient attempting to walk to the bathroom alone pushing tray table        Exercises      Shoulder Instructions       General Comments  VSS on RA    Pertinent Vitals/ Pain       Pain Assessment Pain Assessment: No/denies pain Pain Intervention(s): Monitored during session                                                           Frequency  Min 2X/week        Progress Toward Goals  OT Goals(current goals can now be found in the care plan section)  Progress towards OT goals: Progressing toward goals  Acute Rehab OT Goals OT Goal Formulation: With patient Time For Goal Achievement: 05/11/22 Potential to Achieve Goals: Good  Plan Discharge plan remains appropriate    Co-evaluation                 AM-PAC OT "6 Clicks" Daily Activity     Outcome Measure   Help from another person eating meals?: None Help from another person taking care of personal grooming?: A Little Help from another person toileting, which includes using toliet, bedpan, or urinal?: A Little Help from another person bathing (including washing, rinsing, drying)?: A Lot Help from another person to put on and taking off regular upper body clothing?: A Little Help from another person to put on and taking off regular lower body clothing?: A Lot 6 Click Score: 17    End of Session Equipment Utilized During Treatment: Rolling walker (2 wheels)  OT Visit Diagnosis: Unsteadiness on feet (R26.81);Muscle weakness (generalized) (M62.81);History of falling (Z91.81)   Activity Tolerance Patient tolerated treatment well   Patient Left in chair;with call bell/phone within reach;with chair alarm set   Nurse Communication Mobility status        Time: [id number] OT Time Calculation (min): 23 min  Charges: OT General Charges $OT Visit: 1 Visit OT Treatments $Self Care/Home Management : 23-37 mins  04/30/2022  RP, OTR/L  Acute Rehabilitation Services  Office:  [phone]   Metta Clines 04/30/2022, 9:03 AM

## 2022-04-30 NOTE — Progress Notes (Signed)
Physical Therapy Treatment Patient Details Name: Scott Coleman MRN: [id number] DOB: 27-Mar-1935 Today's Date: 04/30/2022   History of Present Illness 87 y.o. male admitted 3/10 with AMS and found to be in complete heart block. 3/11 PPM placed. PMHx: CAD s/p CABG, DM2, HLD, HTN, CVA    PT Comments    Pt making steady progress. Amb distance limited today by pt getting dc paperwork. Agree with plan for SNF to continue rehab.    Recommendations for follow up therapy are one component of a multi-disciplinary discharge planning process, led by the attending physician.  Recommendations may be updated based on patient status, additional functional criteria and insurance authorization.  Follow Up Recommendations  Skilled nursing-short term rehab (<3 hours/day)     Assistance Recommended at Discharge Frequent or constant Supervision/Assistance  Patient can return home with the following A little help with walking and/or transfers;A lot of help with bathing/dressing/bathroom;Assistance with cooking/housework;Assist for transportation;Help with stairs or ramp for entrance   Equipment Recommendations  None recommended by PT    Recommendations for Other Services       Precautions / Restrictions Precautions Precautions: Fall;ICD/Pacemaker Restrictions Other Position/Activity Restrictions: limit pushing/pulling to L UE and AROM to 90 degrees L UE.     Mobility  Bed Mobility               General bed mobility comments: Pt up in chair    Transfers Overall transfer level: Needs assistance Equipment used: Rolling walker (2 wheels) Transfers: Sit to/from Stand Sit to Stand: Min assist           General transfer comment: Assist to power up and cues for hand placement. Repeated x 4    Ambulation/Gait Ambulation/Gait assistance: Min assist Gait Distance (Feet): 20 Feet (x 2) Assistive device: Rolling walker (2 wheels) Gait Pattern/deviations: Step-through pattern, Decreased  stride length, Knee flexed in stance - left Gait velocity: decr Gait velocity interpretation: <1.8 ft/sec, indicate of risk for recurrent falls   General Gait Details: assist for balance. Verbal cues to roll instead of pick up walker and to look up.   Stairs             Wheelchair Mobility    Modified Rankin (Stroke Patients Only)       Balance Overall balance assessment: Needs assistance, History of Falls Sitting-balance support: No upper extremity supported, Feet supported Sitting balance-Leahy Scale: Good     Standing balance support: Reliant on assistive device for balance, Single extremity supported, Bilateral upper extremity supported Standing balance-Leahy Scale: Poor Standing balance comment: walker and min guard/supervision for static standing                            Cognition Arousal/Alertness: Awake/alert Behavior During Therapy: WFL for tasks assessed/performed Overall Cognitive Status: Within Functional Limits for tasks assessed                                          Exercises      General Comments        Pertinent Vitals/Pain Pain Assessment Pain Assessment: No/denies pain    Home Living                          Prior Function            PT Goals (  current goals can now be found in the care plan section) Acute Rehab PT Goals Patient Stated Goal: return home, go fishing Progress towards PT goals: Progressing toward goals    Frequency    Min 3X/week      PT Plan Current plan remains appropriate    Co-evaluation              AM-PAC PT "6 Clicks" Mobility   Outcome Measure  Help needed turning from your back to your side while in a flat bed without using bedrails?: A Little Help needed moving from lying on your back to sitting on the side of a flat bed without using bedrails?: A Little Help needed moving to and from a bed to a chair (including a wheelchair)?: A Little   Help  needed to walk in hospital room?: A Little Help needed climbing 3-5 steps with a railing? : Total 6 Click Score: 13    End of Session   Activity Tolerance: Patient tolerated treatment well Patient left: in chair;with call bell/phone within reach;with chair alarm set;with family/visitor present   PT Visit Diagnosis: Other abnormalities of gait and mobility (R26.89);Muscle weakness (generalized) (M62.81)     Time: [id number] PT Time Calculation (min) (ACUTE ONLY): 19 min  Charges:  $Gait Training: 8-22 mins                     [place] [facility] [place] 04/30/2022, 10:43 AM

## 2022-05-01 LAB — CULTURE, BLOOD (ROUTINE X 2)
Culture: NO GROWTH
Culture: NO GROWTH
Special Requests: ADEQUATE
Special Requests: ADEQUATE

## 2022-05-03 DIAGNOSIS — M6281 Muscle weakness (generalized): Secondary | ICD-10-CM | POA: Diagnosis not present

## 2022-05-03 DIAGNOSIS — Z950 Presence of cardiac pacemaker: Secondary | ICD-10-CM | POA: Diagnosis not present

## 2022-05-03 DIAGNOSIS — R2689 Other abnormalities of gait and mobility: Secondary | ICD-10-CM | POA: Diagnosis not present

## 2022-05-03 NOTE — Progress Notes (Signed)
Results reviewed. Verbalized understanding

## 2022-05-03 NOTE — Telephone Encounter (Signed)
TOC call : Called patient, NA, LMAM

## 2022-05-03 NOTE — Telephone Encounter (Signed)
Location of hospitalization: [place] Reason for hospitalization: Found lying in the floor, ambulance had been called. Date of discharge: 04/30/2022 Date of first communication with patient: today per daughter Person contacting patient: Me Current symptoms: Patient in Rehab at [facility] Do you understand why you were in the Hospital: Yes Questions regarding discharge instructions: None Where were you discharged to: Home Medications reviewed: Yes Allergies reviewed: Yes Dietary changes reviewed: Yes. Discussed low fat and low salt diet.  Referals reviewed: NA Activities of Daily Living: Able to with mild limitations Any transportation issues/concerns: None Any patient concerns: None Confirmed importance & date/time of Follow up appt: Yes Confirmed with patient if condition begins to worsen call. Pt was given the office number and encouraged to call back with questions or concerns: Yes  04/30/22

## 2022-05-03 NOTE — Progress Notes (Signed)
Called patient, NA, LMAM

## 2022-05-05 ENCOUNTER — Other Ambulatory Visit (HOSPITAL_COMMUNITY): Payer: Self-pay

## 2022-05-06 ENCOUNTER — Ambulatory Visit (INDEPENDENT_AMBULATORY_CARE_PROVIDER_SITE_OTHER): Payer: Medicare HMO

## 2022-05-06 DIAGNOSIS — M6281 Muscle weakness (generalized): Secondary | ICD-10-CM | POA: Diagnosis not present

## 2022-05-06 DIAGNOSIS — I442 Atrioventricular block, complete: Secondary | ICD-10-CM

## 2022-05-06 DIAGNOSIS — R2689 Other abnormalities of gait and mobility: Secondary | ICD-10-CM | POA: Diagnosis not present

## 2022-05-06 DIAGNOSIS — Z950 Presence of cardiac pacemaker: Secondary | ICD-10-CM | POA: Diagnosis not present

## 2022-05-06 LAB — CUP PACEART INCLINIC DEVICE CHECK
Battery Remaining Longevity: 56 mo
Battery Voltage: 3.04 V
Brady Statistic RA Percent Paced: 48 %
Brady Statistic RV Percent Paced: 99 %
Date Time Interrogation Session: 20240321150248
Implantable Lead Connection Status: 753985
Implantable Lead Connection Status: 753985
Implantable Lead Implant Date: 20240311
Implantable Lead Implant Date: 20240311
Implantable Lead Location: 753859
Implantable Lead Location: 753860
Implantable Pulse Generator Implant Date: 20240311
Lead Channel Impedance Value: 300 Ohm
Lead Channel Impedance Value: 575 Ohm
Lead Channel Pacing Threshold Amplitude: 0.75 V
Lead Channel Pacing Threshold Amplitude: 0.75 V
Lead Channel Pacing Threshold Amplitude: 1 V
Lead Channel Pacing Threshold Amplitude: 1 V
Lead Channel Pacing Threshold Pulse Width: 0.5 ms
Lead Channel Pacing Threshold Pulse Width: 0.5 ms
Lead Channel Pacing Threshold Pulse Width: 0.5 ms
Lead Channel Pacing Threshold Pulse Width: 0.5 ms
Lead Channel Sensing Intrinsic Amplitude: 1.8 mV
Lead Channel Sensing Intrinsic Amplitude: 8.7 mV
Lead Channel Setting Pacing Amplitude: 3.5 V
Lead Channel Setting Pacing Amplitude: 3.5 V
Lead Channel Setting Pacing Pulse Width: 0.5 ms
Lead Channel Setting Sensing Sensitivity: 2 mV
Pulse Gen Model: 2272
Pulse Gen Serial Number: 8163669

## 2022-05-06 NOTE — Patient Instructions (Signed)

## 2022-05-06 NOTE — Progress Notes (Signed)
Wound check appointment. Steri-strips removed. Wound without redness or edema. Incision edges approximated, wound well healed. Normal device function. Thresholds, sensing, and impedances consistent with implant measurements. Device programmed at 3.5V/auto capture programmed on for extra safety margin until 3 month visit. Histogram distribution appropriate for patient and level of activity. AT/AF burden <1%/ EGM's appear AT. No high ventricular rates noted. Patient educated about wound care, arm mobility, lifting restrictions. ROV in 3 months with implanting physician.

## 2022-05-10 DIAGNOSIS — M6281 Muscle weakness (generalized): Secondary | ICD-10-CM | POA: Diagnosis not present

## 2022-05-10 DIAGNOSIS — Z950 Presence of cardiac pacemaker: Secondary | ICD-10-CM | POA: Diagnosis not present

## 2022-05-10 DIAGNOSIS — R2689 Other abnormalities of gait and mobility: Secondary | ICD-10-CM | POA: Diagnosis not present

## 2022-05-11 ENCOUNTER — Ambulatory Visit: Payer: Medicare HMO | Admitting: Cardiology

## 2022-05-11 ENCOUNTER — Other Ambulatory Visit: Payer: Self-pay

## 2022-05-11 ENCOUNTER — Encounter: Payer: Self-pay | Admitting: Cardiology

## 2022-05-11 ENCOUNTER — Other Ambulatory Visit (HOSPITAL_COMMUNITY): Payer: Self-pay

## 2022-05-11 VITALS — BP 154/63 | HR 60 | Ht 67.0 in | Wt 153.0 lb

## 2022-05-11 DIAGNOSIS — I6523 Occlusion and stenosis of bilateral carotid arteries: Secondary | ICD-10-CM

## 2022-05-11 DIAGNOSIS — I129 Hypertensive chronic kidney disease with stage 1 through stage 4 chronic kidney disease, or unspecified chronic kidney disease: Secondary | ICD-10-CM

## 2022-05-11 DIAGNOSIS — I2510 Atherosclerotic heart disease of native coronary artery without angina pectoris: Secondary | ICD-10-CM

## 2022-05-11 DIAGNOSIS — Z905 Acquired absence of kidney: Secondary | ICD-10-CM

## 2022-05-11 DIAGNOSIS — Z951 Presence of aortocoronary bypass graft: Secondary | ICD-10-CM

## 2022-05-11 DIAGNOSIS — I429 Cardiomyopathy, unspecified: Secondary | ICD-10-CM | POA: Diagnosis not present

## 2022-05-11 DIAGNOSIS — R9431 Abnormal electrocardiogram [ECG] [EKG]: Secondary | ICD-10-CM | POA: Diagnosis not present

## 2022-05-11 DIAGNOSIS — N183 Chronic kidney disease, stage 3 unspecified: Secondary | ICD-10-CM | POA: Diagnosis not present

## 2022-05-11 DIAGNOSIS — E782 Mixed hyperlipidemia: Secondary | ICD-10-CM

## 2022-05-11 DIAGNOSIS — Z950 Presence of cardiac pacemaker: Secondary | ICD-10-CM | POA: Diagnosis not present

## 2022-05-11 DIAGNOSIS — E1165 Type 2 diabetes mellitus with hyperglycemia: Secondary | ICD-10-CM

## 2022-05-11 DIAGNOSIS — I442 Atrioventricular block, complete: Secondary | ICD-10-CM | POA: Diagnosis not present

## 2022-05-11 MED ORDER — ISOSORB DINITRATE-HYDRALAZINE 20-37.5 MG PO TABS: 1.0000 | ORAL_TABLET | Freq: Three times a day (TID) | ORAL | 0 refills | Status: DC

## 2022-05-11 MED ORDER — ISOSORB DINITRATE-HYDRALAZINE 20-37.5 MG PO TABS
1.0000 | ORAL_TABLET | Freq: Three times a day (TID) | ORAL | 0 refills | Status: DC
  Filled 2022-05-11: qty 90, 30d supply, fill #0

## 2022-05-11 NOTE — Progress Notes (Signed)
ID:  Scott Coleman, DOB 11/29/1935, MRN [id number]  PCP:  Orpah Melter, MD  Cardiologist:  Rex Kras, DO, [facility] (established care 04/25/2022)  Date: 05/11/22  Chief Complaint  Patient presents with  . Heart block AV complete ([place])  . New Patient (Initial Visit)    HPI  Scott Coleman is a 87 y.o. Caucasian male whose past medical history and cardiovascular risk factors include: complete heart block s/p PPM, Hypertension, hyperlipidemia, diabetes mellitus type 2, prostate cancer, history of stroke, right nephrectomy, carotid disease status post right carotid endarterectomy, CAD status post 5 vessel bypass.   Patient presented to the hospital on 04/25/2022 when he was found on the floor by his daughter at home.  EMS arrived and patient was noted to be in heart block.  Transcutaneous pads were placed and he was being paced and round.  Cath Lab was activated and he underwent temporary pacemaker.  Following day he was seen by electrophysiology and given his symptomatic bradycardia due to complete heart block he underwent implantation of dual-chamber [facility] Jude device.  Patient was discharged to SNF and now presents for follow-up.  Overall doing well.  He feels stronger.  He denies any anginal chest pain or heart failure symptoms.  Patient is accompanied by his daughter who does not voice any other concerns today.  FUNCTIONAL STATUS: No structured exercise program or daily routine.   ALLERGIES: Allergies  Allergen Reactions  . Lipitor [Atorvastatin]     Cramps     MEDICATION LIST PRIOR TO VISIT: Current Meds  Medication Sig  . acetaminophen (TYLENOL) 500 MG tablet Take 500 mg by mouth every 6 (six) hours as needed (joint pain).  Marland Kitchen beta carotene w/minerals (OCUVITE) tablet Take 1 tablet by mouth daily.  Marland Kitchen glimepiride (AMARYL) 2 MG tablet Take 2 mg by mouth daily before breakfast.  . latanoprost (XALATAN) 0.005 % ophthalmic solution Place 1 drop into both eyes at bedtime.  Marland Kitchen  losartan-hydrochlorothiazide (HYZAAR) 100-12.5 MG tablet Take 1 tablet by mouth daily.  . metformin (FORTAMET) 500 MG (OSM) 24 hr tablet Take 500 mg by mouth every evening.  . metoprolol succinate (TOPROL-XL) 25 MG 24 hr tablet Take 1 tablet (25 mg total) by mouth daily.  . Multiple Vitamin (MULTIVITAMIN) tablet Take 1 tablet by mouth daily.  . Omega-3 Fatty Acids (FISH OIL PO) Take by mouth 2 (two) times daily.  . simvastatin (ZOCOR) 40 MG tablet Take 40 mg by mouth every evening.  . timolol (TIMOPTIC) 0.25 % ophthalmic solution Place 1 drop into the left eye every morning.  . [DISCONTINUED] isosorbide-hydrALAZINE (BIDIL) 20-37.5 MG tablet Take 1 tablet by mouth 3 (three) times daily.     PAST MEDICAL HISTORY: Past Medical History:  Diagnosis Date  . Coronary artery disease   . Diabetes mellitus   . Encounter for care of pacemaker 04/27/2022  . H/O: CVA (cerebrovascular accident)    Post right CEA  . Heart block AV complete ([place]) 04/25/2022  . Hyperlipidemia   . Hypertension   . Pacemaker [place] [address]-Rf - [id number] dual-chamber pacemaker 04/26/2022 04/26/2022  . Renal cell carcinoma ([place])     PAST SURGICAL HISTORY: Past Surgical History:  Procedure Laterality Date  . CORONARY ARTERY BYPASS GRAFT    . PACEMAKER IMPLANT N/A 04/26/2022   Procedure: PACEMAKER IMPLANT;  Surgeon: Evans Lance, MD;  Location: [place] [facility];  Service: Cardiovascular;  Laterality: N/A;  . TEMPORARY PACEMAKER N/A 04/25/2022   Procedure: TEMPORARY PACEMAKER;  Surgeon: Virgina Jock,  Reynold Bowen, MD;  [facility];  Service: Cardiovascular;  Laterality: N/A;    FAMILY HISTORY: The patient family history includes Stroke in his father.  SOCIAL HISTORY:  The patient  reports that he has never smoked. He does not have any smokeless tobacco history on file. He reports that he does not currently use alcohol.  REVIEW OF SYSTEMS: Review of Systems  Cardiovascular:  Negative for chest  pain, claudication, dyspnea on exertion, irregular heartbeat, leg swelling, near-syncope, orthopnea, palpitations, paroxysmal nocturnal dyspnea and syncope.  Respiratory:  Negative for shortness of breath.   Hematologic/Lymphatic: Negative for bleeding problem.  Musculoskeletal:  Negative for muscle cramps and myalgias.  Neurological:  Negative for dizziness and light-headedness.    PHYSICAL EXAM:    05/11/2022    2:33 PM 04/30/2022    6:02 AM 04/30/2022   12:08 AM  Vitals with BMI  Height 5\' 7"     Weight 153 lbs 154 lbs 9 oz   BMI [id number]   Systolic [id number] [id number]  Diastolic 63 71 85  Pulse 60 63 74    Physical Exam  Constitutional: No distress. He appears chronically ill.  Appears older than stated age, hemodynamically stable, currently in wheelchair  Neck: No JVD present.  Right carotid endarterectomy site is clean dry and well-healed  Cardiovascular: Normal rate, regular rhythm, S1 normal, S2 normal, intact distal pulses and normal pulses. Exam reveals no gallop, no S3 and no S4.  No murmur heard. Pulmonary/Chest: Effort normal and breath sounds normal. No stridor. He has no wheezes. He has no rales.  Sternotomy site is well-healed. Pacemaker present left infraclavicular region.  Abdominal: Soft. Bowel sounds are normal. He exhibits no distension. There is no abdominal tenderness.  Musculoskeletal:        General: No edema.     Cervical back: Neck supple.  Neurological: He is alert and oriented to person, place, and time. He has intact cranial nerves (2-12).  Skin: Skin is warm and moist.     CARDIAC DATABASE: EKG: 05/11/2022: Atrial sensed ventricular paced rhythm, 63 bpm.   Echocardiogram: 04/26/2022:  1. Left ventricular ejection fraction, by estimation, is 45 to 50%. The left ventricle has mildly decreased function. There is mild left ventricular hypertrophy. Left ventricular diastolic function could not be evaluated. Left ventricular diastolic  function could  not be evaluated due to paced rhythm. Mild septal hypokinesis likely due to post-op septum.  2. Right ventricular systolic function is normal. The right ventricular size is normal.  3. The mitral valve is degenerative. Mild mitral valve regurgitation. No evidence of mitral stenosis.  4. The aortic valve is tricuspid. Aortic valve regurgitation is trivial. Aortic valve sclerosis is present, with no evidence of aortic valve stenosis.  5. The inferior vena cava is normal in size with greater than 50% respiratory variability, suggesting right atrial pressure of 3 mmHg.  6. Rhythm strip during this exam demonstrates a paced rhythm.     Stress Testing: No results found for this or any previous visit from the past 1095 days.   Heart Catheterization: None  EP PPM/ICD IMPLANT  04/26/2022 1. Successful implantation of a St. Jude Assurity Dr-Rf [id number] dual-chamber pacemaker for symptomatic bradycardia due to CHB   2. Successful removal of a temporary TV PM 3. No early apparent complications.       [name], MD 04/26/2022 7:00 PM   LABORATORY DATA:    Latest Ref Rng & Units 04/30/2022   12:47 AM  04/29/2022   12:42 AM 04/28/2022    7:07 AM  CBC  WBC 4.0 - 10.5 K/uL 7.5  8.0  9.5   Hemoglobin 13.0 - 17.0 g/dL 13.5  14.1  15.3   Hematocrit 39.0 - 52.0 % 39.7  40.3  44.1   Platelets 150 - 400 K/uL 187  177  202        Latest Ref Rng & Units 04/30/2022   12:47 AM 04/29/2022   12:42 AM 04/28/2022    7:07 AM  CMP  Glucose 70 - 99 mg/dL 131  138  130   BUN 8 - 23 mg/dL 29  23  18    Creatinine 0.61 - 1.24 mg/dL 1.58  1.51  1.42   Sodium 135 - 145 mmol/L 135  135  136   Potassium 3.5 - 5.1 mmol/L 4.2  4.4  4.0   Chloride 98 - 111 mmol/L 104  105  100   CO2 22 - 32 mmol/L 24  22  23    Calcium 8.9 - 10.3 mg/dL 8.6  8.9  9.4     Lipid Panel     Component Value Date/Time   CHOL 112 04/26/2022 0619   TRIG 127 04/26/2022 0619   HDL 33 (L) 04/26/2022 0619   CHOLHDL 3.4 04/26/2022 0619    VLDL 25 04/26/2022 0619   LDLCALC 54 04/26/2022 0619   LDLDIRECT 55 04/26/2022 0619    No components found for: "NTPROBNP" No results for input(s): "PROBNP" in the last 8760 hours. Recent Labs    04/25/22 2014  TSH 5.554*    BMP Recent Labs    04/28/22 0707 04/29/22 0042 04/30/22 0047  NA 136 135 135  K 4.0 4.4 4.2  CL 100 105 104  CO2 23 22 24   GLUCOSE 130* 138* 131*  BUN 18 23 29*  CREATININE 1.42* 1.51* 1.58*  CALCIUM 9.4 8.9 8.6*  GFRNONAA 49* 45* 43*    HEMOGLOBIN A1C Lab Results  Component Value Date   HGBA1C 8.5 (H) 04/26/2022   MPG 197 04/26/2022    IMPRESSION:    ICD-10-CM   1. Heart block AV complete (HCC)  I44.2 EKG 12-Lead    PCV MYOCARDIAL PERFUSION WITH LEXISCAN    2. Pacemaker [place] Dr-Rf - [id number] dual-chamber pacemaker 04/26/2022  Z95.0 PCV MYOCARDIAL PERFUSION WITH LEXISCAN    3. Coronary artery disease involving native coronary artery of native heart without angina pectoris  I25.10 PCV MYOCARDIAL PERFUSION WITH LEXISCAN    DISCONTINUED: isosorbide-hydrALAZINE (BIDIL) 20-37.5 MG tablet    4. Hx of CABG  Z95.1 PCV MYOCARDIAL PERFUSION WITH LEXISCAN    DISCONTINUED: isosorbide-hydrALAZINE (BIDIL) 20-37.5 MG tablet    5. Cardiomyopathy, unspecified type ([place])  I42.9 PCV MYOCARDIAL PERFUSION WITH LEXISCAN    DISCONTINUED: isosorbide-hydrALAZINE (BIDIL) 20-37.5 MG tablet    6. Bilateral carotid artery stenosis  I65.23 PCV CAROTID DUPLEX (BILATERAL)    7. Mixed hyperlipidemia  E78.2     8. History of nephrectomy, right  Z90.5     9. Benign hypertension with CKD (chronic kidney disease) stage III (HCC)  I12.9 DISCONTINUED: isosorbide-hydrALAZINE (BIDIL) 20-37.5 MG tablet   N18.30     10. Type 2 diabetes mellitus with hyperglycemia, without long-term current use of insulin (HCC)  E11.65     11. Abnormal electrocardiogram  R94.31        RECOMMENDATIONS: Scott Coleman is a 87 y.o. Caucasian male whose past medical history and  cardiac risk factors include: complete heart block s/p PPM,  Hypertension, hyperlipidemia, diabetes mellitus type 2, prostate cancer, history of stroke, right nephrectomy, carotid disease status post right carotid endarterectomy, CAD status post 5 vessel bypass 2012 (LIMA-LAD, SVG-diag, seq SVG-OM2, OM4, SVG-RCA).   Heart block AV complete [facility]) Pacemaker St. Jude Assurity Dr-Rf - [id number] dual-chamber pacemaker 04/26/2022 Presented to the ED in March 2024 with symptomatic complete heart block Initially underwent temporary pacing followed by permanent pacemaker implant. Still follows up with Dr. Lovena Le for now. EKG today shows atrial sensed ventricular paced rhythm.  Coronary artery disease involving native coronary artery of native heart without angina pectoris Hx of CABG 2012 (LIMA-LAD, SVG-diag, seq SVG-OM2, OM4, SVG-RCA) Cardiomyopathy, unspecified type ([place]) History of 5 vessel bypass in 2012. No cardiology follow-up in several years. Given the recently discovered conduction disease, prior CAD status post CABG, multiple cardiovascular risk factors including diabetes patient is wanting to proceed with a diagnostic testing to evaluate for reversible ischemia. Patient is unable to exercise and EKG is paced and therefore recommend pharmacological stress to evaluate for reversible ischemia. Will restart aspirin once cleared by EP. Continue statin therapy.  Most recent lipids notes LDL direct today 55 mg/dL. Medications reconciled.  Bilateral carotid artery stenosis History of right carotid endarterectomy. No recent carotid duplex available for review. Will schedule for carotid duplex to reevaluate disease burden  Mixed hyperlipidemia Currently on simvastatin.   He denies myalgia or other side effects. Most recent lipids dated March 2024, independently reviewed as noted above.  Benign hypertension with CKD (chronic kidney disease) stage III (HCC) History of nephrectomy, right Office  blood pressures are not well-controlled. Start BiDil 20/37.5 mg p.o. 3 times daily. Reemphasized importance of low-salt diet. Once he is more physically active recommend up titration of medical therapy.   Consider addition of either [place] or Jardiance given his CAD, cardiomyopathy, diabetes Also would recommend transitioning losartan to Entresto and HCTZ to spironolactone.  Type 2 diabetes mellitus with hyperglycemia, without long-term current use of insulin ([place]) Management as discussed above  FINAL MEDICATION LIST END OF ENCOUNTER: Meds ordered this encounter  Medications  . DISCONTD: isosorbide-hydrALAZINE (BIDIL) 20-37.5 MG tablet    Sig: Take 1 tablet by mouth 3 (three) times daily.    Dispense:  90 tablet    Refill:  0    There are no discontinued medications.   Current Outpatient Medications:  .  acetaminophen (TYLENOL) 500 MG tablet, Take 500 mg by mouth every 6 (six) hours as needed (joint pain)., Disp: , Rfl:  .  beta carotene w/minerals (OCUVITE) tablet, Take 1 tablet by mouth daily., Disp: , Rfl:  .  glimepiride (AMARYL) 2 MG tablet, Take 2 mg by mouth daily before breakfast., Disp: , Rfl:  .  latanoprost (XALATAN) 0.005 % ophthalmic solution, Place 1 drop into both eyes at bedtime., Disp: , Rfl:  .  losartan-hydrochlorothiazide (HYZAAR) 100-12.5 MG tablet, Take 1 tablet by mouth daily., Disp: , Rfl:  .  metformin (FORTAMET) 500 MG (OSM) 24 hr tablet, Take 500 mg by mouth every evening., Disp: , Rfl:  .  metoprolol succinate (TOPROL-XL) 25 MG 24 hr tablet, Take 1 tablet (25 mg total) by mouth daily., Disp: 30 tablet, Rfl: 2 .  Multiple Vitamin (MULTIVITAMIN) tablet, Take 1 tablet by mouth daily., Disp: , Rfl:  .  Omega-3 Fatty Acids (FISH OIL PO), Take by mouth 2 (two) times daily., Disp: , Rfl:  .  simvastatin (ZOCOR) 40 MG tablet, Take 40 mg by mouth every evening., Disp: , Rfl:  .  timolol (TIMOPTIC) 0.25 % ophthalmic solution, Place 1 drop into the left eye every  morning., Disp: , Rfl:  .  isosorbide-hydrALAZINE (BIDIL) 20-37.5 MG tablet, Take 1 tablet by mouth 3 (three) times daily., Disp: 90 tablet, Rfl: 0  Orders Placed This Encounter  Procedures  . PCV MYOCARDIAL PERFUSION WITH LEXISCAN  . EKG 12-Lead  . PCV CAROTID DUPLEX (BILATERAL)    There are no Patient Instructions on file for this visit.   --Continue cardiac medications as reconciled in final medication list. --Return in about 3 months (around 08/11/2022) for Follow up, CAD, status post heart block and pacemaker implant, discuss test results. or sooner if needed. --Continue follow-up with your primary care physician regarding the management of your other chronic comorbid conditions.  Patient's questions and concerns were addressed to his satisfaction. He voices understanding of the instructions provided during this encounter.   This note was created using a voice recognition software as a result there may be grammatical errors inadvertently enclosed that do not reflect the nature of this encounter. Every attempt is made to correct such errors.  Rex Kras, [place], [facility]  Pager:  [phone] Office: [phone]

## 2022-05-12 ENCOUNTER — Telehealth: Payer: Self-pay

## 2022-05-12 NOTE — Telephone Encounter (Signed)
Patients daughter calling to let us know that the isosorbide-hydralazine is not covered by pts insurance and is too expensive. Is there another option he can try?

## 2022-05-12 NOTE — Telephone Encounter (Signed)
Change it to hydralazine 25mg  po tid and isordil 20mg  po tid.  Discontinue bidil   Dr. Terri Skains

## 2022-05-13 DIAGNOSIS — R2689 Other abnormalities of gait and mobility: Secondary | ICD-10-CM | POA: Diagnosis not present

## 2022-05-13 DIAGNOSIS — Z950 Presence of cardiac pacemaker: Secondary | ICD-10-CM | POA: Diagnosis not present

## 2022-05-13 DIAGNOSIS — M6281 Muscle weakness (generalized): Secondary | ICD-10-CM | POA: Diagnosis not present

## 2022-05-14 DIAGNOSIS — I2510 Atherosclerotic heart disease of native coronary artery without angina pectoris: Secondary | ICD-10-CM | POA: Diagnosis not present

## 2022-05-14 DIAGNOSIS — I10 Essential (primary) hypertension: Secondary | ICD-10-CM | POA: Diagnosis not present

## 2022-05-14 DIAGNOSIS — E118 Type 2 diabetes mellitus with unspecified complications: Secondary | ICD-10-CM | POA: Diagnosis not present

## 2022-05-14 DIAGNOSIS — E785 Hyperlipidemia, unspecified: Secondary | ICD-10-CM | POA: Diagnosis not present

## 2022-05-16 DIAGNOSIS — I442 Atrioventricular block, complete: Secondary | ICD-10-CM | POA: Diagnosis not present

## 2022-05-16 DIAGNOSIS — I2510 Atherosclerotic heart disease of native coronary artery without angina pectoris: Secondary | ICD-10-CM | POA: Diagnosis not present

## 2022-05-16 DIAGNOSIS — I10 Essential (primary) hypertension: Secondary | ICD-10-CM | POA: Diagnosis not present

## 2022-05-16 DIAGNOSIS — Z85828 Personal history of other malignant neoplasm of skin: Secondary | ICD-10-CM | POA: Diagnosis not present

## 2022-05-16 DIAGNOSIS — F17220 Nicotine dependence, chewing tobacco, uncomplicated: Secondary | ICD-10-CM | POA: Diagnosis not present

## 2022-05-16 DIAGNOSIS — E785 Hyperlipidemia, unspecified: Secondary | ICD-10-CM | POA: Diagnosis not present

## 2022-05-16 DIAGNOSIS — I69391 Dysphagia following cerebral infarction: Secondary | ICD-10-CM | POA: Diagnosis not present

## 2022-05-16 DIAGNOSIS — E119 Type 2 diabetes mellitus without complications: Secondary | ICD-10-CM | POA: Diagnosis not present

## 2022-05-16 DIAGNOSIS — Z48812 Encounter for surgical aftercare following surgery on the circulatory system: Secondary | ICD-10-CM | POA: Diagnosis not present

## 2022-05-18 DIAGNOSIS — Z48812 Encounter for surgical aftercare following surgery on the circulatory system: Secondary | ICD-10-CM | POA: Diagnosis not present

## 2022-05-18 DIAGNOSIS — I69391 Dysphagia following cerebral infarction: Secondary | ICD-10-CM | POA: Diagnosis not present

## 2022-05-18 DIAGNOSIS — E782 Mixed hyperlipidemia: Secondary | ICD-10-CM | POA: Diagnosis not present

## 2022-05-18 DIAGNOSIS — Z950 Presence of cardiac pacemaker: Secondary | ICD-10-CM | POA: Diagnosis not present

## 2022-05-18 DIAGNOSIS — I10 Essential (primary) hypertension: Secondary | ICD-10-CM | POA: Diagnosis not present

## 2022-05-18 DIAGNOSIS — F17220 Nicotine dependence, chewing tobacco, uncomplicated: Secondary | ICD-10-CM | POA: Diagnosis not present

## 2022-05-18 DIAGNOSIS — Z85828 Personal history of other malignant neoplasm of skin: Secondary | ICD-10-CM | POA: Diagnosis not present

## 2022-05-18 DIAGNOSIS — I442 Atrioventricular block, complete: Secondary | ICD-10-CM | POA: Diagnosis not present

## 2022-05-18 DIAGNOSIS — N1832 Chronic kidney disease, stage 3b: Secondary | ICD-10-CM | POA: Diagnosis not present

## 2022-05-18 DIAGNOSIS — I700 Atherosclerosis of aorta: Secondary | ICD-10-CM | POA: Diagnosis not present

## 2022-05-18 DIAGNOSIS — E1122 Type 2 diabetes mellitus with diabetic chronic kidney disease: Secondary | ICD-10-CM | POA: Diagnosis not present

## 2022-05-18 DIAGNOSIS — R262 Difficulty in walking, not elsewhere classified: Secondary | ICD-10-CM | POA: Diagnosis not present

## 2022-05-18 DIAGNOSIS — E119 Type 2 diabetes mellitus without complications: Secondary | ICD-10-CM | POA: Diagnosis not present

## 2022-05-18 DIAGNOSIS — E785 Hyperlipidemia, unspecified: Secondary | ICD-10-CM | POA: Diagnosis not present

## 2022-05-18 DIAGNOSIS — I2510 Atherosclerotic heart disease of native coronary artery without angina pectoris: Secondary | ICD-10-CM | POA: Diagnosis not present

## 2022-05-19 ENCOUNTER — Telehealth: Payer: Self-pay

## 2022-05-19 DIAGNOSIS — I69391 Dysphagia following cerebral infarction: Secondary | ICD-10-CM | POA: Diagnosis not present

## 2022-05-19 DIAGNOSIS — I442 Atrioventricular block, complete: Secondary | ICD-10-CM | POA: Diagnosis not present

## 2022-05-19 DIAGNOSIS — E785 Hyperlipidemia, unspecified: Secondary | ICD-10-CM | POA: Diagnosis not present

## 2022-05-19 DIAGNOSIS — Z85828 Personal history of other malignant neoplasm of skin: Secondary | ICD-10-CM | POA: Diagnosis not present

## 2022-05-19 DIAGNOSIS — E119 Type 2 diabetes mellitus without complications: Secondary | ICD-10-CM | POA: Diagnosis not present

## 2022-05-19 DIAGNOSIS — I10 Essential (primary) hypertension: Secondary | ICD-10-CM | POA: Diagnosis not present

## 2022-05-19 DIAGNOSIS — I2510 Atherosclerotic heart disease of native coronary artery without angina pectoris: Secondary | ICD-10-CM | POA: Diagnosis not present

## 2022-05-19 DIAGNOSIS — F17220 Nicotine dependence, chewing tobacco, uncomplicated: Secondary | ICD-10-CM | POA: Diagnosis not present

## 2022-05-19 DIAGNOSIS — Z48812 Encounter for surgical aftercare following surgery on the circulatory system: Secondary | ICD-10-CM | POA: Diagnosis not present

## 2022-05-19 NOTE — Telephone Encounter (Signed)
Patients daughter aware I took bidil off med list as well

## 2022-05-19 NOTE — Telephone Encounter (Signed)
Can hold off on BiDil for now if home blood pressures are well controlled.  Please keep a log of BP and will review at the next visit.   Lydiana Milley Herrick, DO, [facility]

## 2022-05-19 NOTE — Telephone Encounter (Signed)
Patients daughter called and said that the pcp doesn't think patient needs to take the bidil his bp has been 116/70. Also the bidil is too expensive and I saw that you said it can be split up ,but they still dont think he needs it, please advise.

## 2022-05-21 ENCOUNTER — Other Ambulatory Visit: Payer: Self-pay

## 2022-05-21 MED ORDER — ISOSORBIDE DINITRATE 20 MG PO TABS: 20.0000 mg | ORAL_TABLET | Freq: Three times a day (TID) | ORAL | 2 refills | Status: AC

## 2022-05-21 MED ORDER — HYDRALAZINE HCL 25 MG PO TABS: 25.0000 mg | ORAL_TABLET | Freq: Three times a day (TID) | ORAL | 2 refills | Status: AC

## 2022-05-21 NOTE — Telephone Encounter (Signed)
Sent these medications in and called the patients daughter to let her know.

## 2022-05-24 DIAGNOSIS — E119 Type 2 diabetes mellitus without complications: Secondary | ICD-10-CM | POA: Diagnosis not present

## 2022-05-24 DIAGNOSIS — I442 Atrioventricular block, complete: Secondary | ICD-10-CM | POA: Diagnosis not present

## 2022-05-24 DIAGNOSIS — I2510 Atherosclerotic heart disease of native coronary artery without angina pectoris: Secondary | ICD-10-CM | POA: Diagnosis not present

## 2022-05-24 DIAGNOSIS — Z85828 Personal history of other malignant neoplasm of skin: Secondary | ICD-10-CM | POA: Diagnosis not present

## 2022-05-24 DIAGNOSIS — E785 Hyperlipidemia, unspecified: Secondary | ICD-10-CM | POA: Diagnosis not present

## 2022-05-24 DIAGNOSIS — I69391 Dysphagia following cerebral infarction: Secondary | ICD-10-CM | POA: Diagnosis not present

## 2022-05-24 DIAGNOSIS — Z48812 Encounter for surgical aftercare following surgery on the circulatory system: Secondary | ICD-10-CM | POA: Diagnosis not present

## 2022-05-24 DIAGNOSIS — F17220 Nicotine dependence, chewing tobacco, uncomplicated: Secondary | ICD-10-CM | POA: Diagnosis not present

## 2022-05-24 DIAGNOSIS — I10 Essential (primary) hypertension: Secondary | ICD-10-CM | POA: Diagnosis not present

## 2022-05-25 DIAGNOSIS — F17220 Nicotine dependence, chewing tobacco, uncomplicated: Secondary | ICD-10-CM | POA: Diagnosis not present

## 2022-05-25 DIAGNOSIS — Z48812 Encounter for surgical aftercare following surgery on the circulatory system: Secondary | ICD-10-CM | POA: Diagnosis not present

## 2022-05-25 DIAGNOSIS — I10 Essential (primary) hypertension: Secondary | ICD-10-CM | POA: Diagnosis not present

## 2022-05-25 DIAGNOSIS — E785 Hyperlipidemia, unspecified: Secondary | ICD-10-CM | POA: Diagnosis not present

## 2022-05-25 DIAGNOSIS — Z85828 Personal history of other malignant neoplasm of skin: Secondary | ICD-10-CM | POA: Diagnosis not present

## 2022-05-25 DIAGNOSIS — E119 Type 2 diabetes mellitus without complications: Secondary | ICD-10-CM | POA: Diagnosis not present

## 2022-05-25 DIAGNOSIS — I69391 Dysphagia following cerebral infarction: Secondary | ICD-10-CM | POA: Diagnosis not present

## 2022-05-25 DIAGNOSIS — I442 Atrioventricular block, complete: Secondary | ICD-10-CM | POA: Diagnosis not present

## 2022-05-25 DIAGNOSIS — I2510 Atherosclerotic heart disease of native coronary artery without angina pectoris: Secondary | ICD-10-CM | POA: Diagnosis not present

## 2022-05-26 DIAGNOSIS — I2510 Atherosclerotic heart disease of native coronary artery without angina pectoris: Secondary | ICD-10-CM | POA: Diagnosis not present

## 2022-05-26 DIAGNOSIS — Z48812 Encounter for surgical aftercare following surgery on the circulatory system: Secondary | ICD-10-CM | POA: Diagnosis not present

## 2022-05-26 DIAGNOSIS — I442 Atrioventricular block, complete: Secondary | ICD-10-CM | POA: Diagnosis not present

## 2022-05-26 DIAGNOSIS — E119 Type 2 diabetes mellitus without complications: Secondary | ICD-10-CM | POA: Diagnosis not present

## 2022-05-26 DIAGNOSIS — Z85828 Personal history of other malignant neoplasm of skin: Secondary | ICD-10-CM | POA: Diagnosis not present

## 2022-05-26 DIAGNOSIS — I10 Essential (primary) hypertension: Secondary | ICD-10-CM | POA: Diagnosis not present

## 2022-05-26 DIAGNOSIS — F17220 Nicotine dependence, chewing tobacco, uncomplicated: Secondary | ICD-10-CM | POA: Diagnosis not present

## 2022-05-26 DIAGNOSIS — E785 Hyperlipidemia, unspecified: Secondary | ICD-10-CM | POA: Diagnosis not present

## 2022-05-26 DIAGNOSIS — I69391 Dysphagia following cerebral infarction: Secondary | ICD-10-CM | POA: Diagnosis not present

## 2022-05-27 DIAGNOSIS — Z48812 Encounter for surgical aftercare following surgery on the circulatory system: Secondary | ICD-10-CM | POA: Diagnosis not present

## 2022-05-27 DIAGNOSIS — I69391 Dysphagia following cerebral infarction: Secondary | ICD-10-CM | POA: Diagnosis not present

## 2022-05-27 DIAGNOSIS — Z85828 Personal history of other malignant neoplasm of skin: Secondary | ICD-10-CM | POA: Diagnosis not present

## 2022-05-27 DIAGNOSIS — I442 Atrioventricular block, complete: Secondary | ICD-10-CM | POA: Diagnosis not present

## 2022-05-27 DIAGNOSIS — I2510 Atherosclerotic heart disease of native coronary artery without angina pectoris: Secondary | ICD-10-CM | POA: Diagnosis not present

## 2022-05-27 DIAGNOSIS — E785 Hyperlipidemia, unspecified: Secondary | ICD-10-CM | POA: Diagnosis not present

## 2022-05-27 DIAGNOSIS — I10 Essential (primary) hypertension: Secondary | ICD-10-CM | POA: Diagnosis not present

## 2022-05-27 DIAGNOSIS — E119 Type 2 diabetes mellitus without complications: Secondary | ICD-10-CM | POA: Diagnosis not present

## 2022-05-27 DIAGNOSIS — F17220 Nicotine dependence, chewing tobacco, uncomplicated: Secondary | ICD-10-CM | POA: Diagnosis not present

## 2022-06-01 DIAGNOSIS — Z48812 Encounter for surgical aftercare following surgery on the circulatory system: Secondary | ICD-10-CM | POA: Diagnosis not present

## 2022-06-01 DIAGNOSIS — E119 Type 2 diabetes mellitus without complications: Secondary | ICD-10-CM | POA: Diagnosis not present

## 2022-06-01 DIAGNOSIS — Z85828 Personal history of other malignant neoplasm of skin: Secondary | ICD-10-CM | POA: Diagnosis not present

## 2022-06-01 DIAGNOSIS — I2510 Atherosclerotic heart disease of native coronary artery without angina pectoris: Secondary | ICD-10-CM | POA: Diagnosis not present

## 2022-06-01 DIAGNOSIS — I10 Essential (primary) hypertension: Secondary | ICD-10-CM | POA: Diagnosis not present

## 2022-06-01 DIAGNOSIS — E785 Hyperlipidemia, unspecified: Secondary | ICD-10-CM | POA: Diagnosis not present

## 2022-06-01 DIAGNOSIS — F17220 Nicotine dependence, chewing tobacco, uncomplicated: Secondary | ICD-10-CM | POA: Diagnosis not present

## 2022-06-01 DIAGNOSIS — I69391 Dysphagia following cerebral infarction: Secondary | ICD-10-CM | POA: Diagnosis not present

## 2022-06-01 DIAGNOSIS — I442 Atrioventricular block, complete: Secondary | ICD-10-CM | POA: Diagnosis not present

## 2022-06-03 DIAGNOSIS — I10 Essential (primary) hypertension: Secondary | ICD-10-CM | POA: Diagnosis not present

## 2022-06-03 DIAGNOSIS — I69391 Dysphagia following cerebral infarction: Secondary | ICD-10-CM | POA: Diagnosis not present

## 2022-06-03 DIAGNOSIS — E785 Hyperlipidemia, unspecified: Secondary | ICD-10-CM | POA: Diagnosis not present

## 2022-06-03 DIAGNOSIS — Z85828 Personal history of other malignant neoplasm of skin: Secondary | ICD-10-CM | POA: Diagnosis not present

## 2022-06-03 DIAGNOSIS — E119 Type 2 diabetes mellitus without complications: Secondary | ICD-10-CM | POA: Diagnosis not present

## 2022-06-03 DIAGNOSIS — I2510 Atherosclerotic heart disease of native coronary artery without angina pectoris: Secondary | ICD-10-CM | POA: Diagnosis not present

## 2022-06-03 DIAGNOSIS — F17220 Nicotine dependence, chewing tobacco, uncomplicated: Secondary | ICD-10-CM | POA: Diagnosis not present

## 2022-06-03 DIAGNOSIS — I442 Atrioventricular block, complete: Secondary | ICD-10-CM | POA: Diagnosis not present

## 2022-06-03 DIAGNOSIS — Z48812 Encounter for surgical aftercare following surgery on the circulatory system: Secondary | ICD-10-CM | POA: Diagnosis not present

## 2022-06-04 DIAGNOSIS — I442 Atrioventricular block, complete: Secondary | ICD-10-CM | POA: Diagnosis not present

## 2022-06-04 DIAGNOSIS — I2510 Atherosclerotic heart disease of native coronary artery without angina pectoris: Secondary | ICD-10-CM | POA: Diagnosis not present

## 2022-06-04 DIAGNOSIS — Z85828 Personal history of other malignant neoplasm of skin: Secondary | ICD-10-CM | POA: Diagnosis not present

## 2022-06-04 DIAGNOSIS — I69391 Dysphagia following cerebral infarction: Secondary | ICD-10-CM | POA: Diagnosis not present

## 2022-06-04 DIAGNOSIS — E785 Hyperlipidemia, unspecified: Secondary | ICD-10-CM | POA: Diagnosis not present

## 2022-06-04 DIAGNOSIS — E119 Type 2 diabetes mellitus without complications: Secondary | ICD-10-CM | POA: Diagnosis not present

## 2022-06-04 DIAGNOSIS — Z48812 Encounter for surgical aftercare following surgery on the circulatory system: Secondary | ICD-10-CM | POA: Diagnosis not present

## 2022-06-04 DIAGNOSIS — I10 Essential (primary) hypertension: Secondary | ICD-10-CM | POA: Diagnosis not present

## 2022-06-04 DIAGNOSIS — F17220 Nicotine dependence, chewing tobacco, uncomplicated: Secondary | ICD-10-CM | POA: Diagnosis not present

## 2022-06-07 DIAGNOSIS — Z85828 Personal history of other malignant neoplasm of skin: Secondary | ICD-10-CM | POA: Diagnosis not present

## 2022-06-07 DIAGNOSIS — I2510 Atherosclerotic heart disease of native coronary artery without angina pectoris: Secondary | ICD-10-CM | POA: Diagnosis not present

## 2022-06-07 DIAGNOSIS — I442 Atrioventricular block, complete: Secondary | ICD-10-CM | POA: Diagnosis not present

## 2022-06-07 DIAGNOSIS — E119 Type 2 diabetes mellitus without complications: Secondary | ICD-10-CM | POA: Diagnosis not present

## 2022-06-07 DIAGNOSIS — F17220 Nicotine dependence, chewing tobacco, uncomplicated: Secondary | ICD-10-CM | POA: Diagnosis not present

## 2022-06-07 DIAGNOSIS — Z48812 Encounter for surgical aftercare following surgery on the circulatory system: Secondary | ICD-10-CM | POA: Diagnosis not present

## 2022-06-07 DIAGNOSIS — E785 Hyperlipidemia, unspecified: Secondary | ICD-10-CM | POA: Diagnosis not present

## 2022-06-07 DIAGNOSIS — I69391 Dysphagia following cerebral infarction: Secondary | ICD-10-CM | POA: Diagnosis not present

## 2022-06-07 DIAGNOSIS — I10 Essential (primary) hypertension: Secondary | ICD-10-CM | POA: Diagnosis not present

## 2022-06-08 DIAGNOSIS — Z85828 Personal history of other malignant neoplasm of skin: Secondary | ICD-10-CM | POA: Diagnosis not present

## 2022-06-08 DIAGNOSIS — I442 Atrioventricular block, complete: Secondary | ICD-10-CM | POA: Diagnosis not present

## 2022-06-08 DIAGNOSIS — I10 Essential (primary) hypertension: Secondary | ICD-10-CM | POA: Diagnosis not present

## 2022-06-08 DIAGNOSIS — F17220 Nicotine dependence, chewing tobacco, uncomplicated: Secondary | ICD-10-CM | POA: Diagnosis not present

## 2022-06-08 DIAGNOSIS — Z48812 Encounter for surgical aftercare following surgery on the circulatory system: Secondary | ICD-10-CM | POA: Diagnosis not present

## 2022-06-08 DIAGNOSIS — I2510 Atherosclerotic heart disease of native coronary artery without angina pectoris: Secondary | ICD-10-CM | POA: Diagnosis not present

## 2022-06-08 DIAGNOSIS — E785 Hyperlipidemia, unspecified: Secondary | ICD-10-CM | POA: Diagnosis not present

## 2022-06-08 DIAGNOSIS — I69391 Dysphagia following cerebral infarction: Secondary | ICD-10-CM | POA: Diagnosis not present

## 2022-06-08 DIAGNOSIS — E119 Type 2 diabetes mellitus without complications: Secondary | ICD-10-CM | POA: Diagnosis not present

## 2022-06-10 ENCOUNTER — Other Ambulatory Visit: Payer: Self-pay

## 2022-06-10 MED ORDER — METOPROLOL SUCCINATE ER 25 MG PO TB24: 25.0000 mg | ORAL_TABLET | Freq: Every day | ORAL | 0 refills | Status: DC

## 2022-06-11 DIAGNOSIS — I442 Atrioventricular block, complete: Secondary | ICD-10-CM | POA: Diagnosis not present

## 2022-06-11 DIAGNOSIS — E119 Type 2 diabetes mellitus without complications: Secondary | ICD-10-CM | POA: Diagnosis not present

## 2022-06-11 DIAGNOSIS — E785 Hyperlipidemia, unspecified: Secondary | ICD-10-CM | POA: Diagnosis not present

## 2022-06-11 DIAGNOSIS — I69391 Dysphagia following cerebral infarction: Secondary | ICD-10-CM | POA: Diagnosis not present

## 2022-06-11 DIAGNOSIS — Z48812 Encounter for surgical aftercare following surgery on the circulatory system: Secondary | ICD-10-CM | POA: Diagnosis not present

## 2022-06-11 DIAGNOSIS — Z85828 Personal history of other malignant neoplasm of skin: Secondary | ICD-10-CM | POA: Diagnosis not present

## 2022-06-11 DIAGNOSIS — I2510 Atherosclerotic heart disease of native coronary artery without angina pectoris: Secondary | ICD-10-CM | POA: Diagnosis not present

## 2022-06-11 DIAGNOSIS — F17220 Nicotine dependence, chewing tobacco, uncomplicated: Secondary | ICD-10-CM | POA: Diagnosis not present

## 2022-06-11 DIAGNOSIS — I10 Essential (primary) hypertension: Secondary | ICD-10-CM | POA: Diagnosis not present

## 2022-06-14 ENCOUNTER — Other Ambulatory Visit: Payer: Medicare HMO

## 2022-07-07 ENCOUNTER — Ambulatory Visit: Payer: Medicare HMO | Attending: Cardiovascular Disease | Admitting: Cardiovascular Disease

## 2022-07-07 ENCOUNTER — Encounter: Payer: Self-pay | Admitting: Cardiovascular Disease

## 2022-07-07 VITALS — BP 164/70 | HR 61 | Ht 67.0 in | Wt 154.4 lb

## 2022-07-07 DIAGNOSIS — I129 Hypertensive chronic kidney disease with stage 1 through stage 4 chronic kidney disease, or unspecified chronic kidney disease: Secondary | ICD-10-CM

## 2022-07-07 DIAGNOSIS — I442 Atrioventricular block, complete: Secondary | ICD-10-CM | POA: Diagnosis not present

## 2022-07-07 DIAGNOSIS — I6523 Occlusion and stenosis of bilateral carotid arteries: Secondary | ICD-10-CM | POA: Diagnosis not present

## 2022-07-07 DIAGNOSIS — E782 Mixed hyperlipidemia: Secondary | ICD-10-CM

## 2022-07-07 DIAGNOSIS — I2510 Atherosclerotic heart disease of native coronary artery without angina pectoris: Secondary | ICD-10-CM | POA: Diagnosis not present

## 2022-07-07 DIAGNOSIS — N183 Chronic kidney disease, stage 3 unspecified: Secondary | ICD-10-CM

## 2022-07-07 MED ORDER — METOPROLOL SUCCINATE ER 25 MG PO TB24: 25.0000 mg | ORAL_TABLET | Freq: Every day | ORAL | 3 refills | Status: AC

## 2022-07-07 NOTE — Progress Notes (Signed)
Chief Complaint  Patient presents with   New Patient (Initial Visit)    CAD, complete heart block   History of Present Illness: 87 yo male with history of HTN, HLD, DM, prostate cancer, prior CVA, carotid artery disease s/p right carotid endarterectomy, renal cell carcinoma s/p right nephrectomy, CAD s/p 5V CABG in 2012, ischemic cardiomyopathy and complete heart block s/p pacemaker who is here to establish cardiology care. He was admitted to [facility] in March 2024 after being found on the floor with waxing and waning consciousness by his daughter. EMS arrived and he was in complete heart block. Emergent temporary pacemaker placed and permanent pacemaker placed prior to discharge. He had 5V CABG in 2012 at [facility]. Echo March 2024 with LVEF=45-50%. Mild LVH. Mild MR. He was seen in [facility] on 05/11/22 for hospital follow up and was doing well.   He tells me today that he feels well overall. He uses a walker at home. No chest pain, dyspnea or dizziness. He has not been taking the Toprol.   Primary Care Physician: Joycelyn Rua, MD   Past Medical History:  Diagnosis Date   Carotid artery disease [facility])    Coronary artery disease    Diabetes mellitus    Encounter for care of pacemaker 04/27/2022   H/O: CVA (cerebrovascular accident)    Post right CEA   Heart block AV complete (HCC) 04/25/2022   Hyperlipidemia    Hypertension    Pacemaker St. Jude Assurity Dr-Rf - [id number] dual-chamber pacemaker 04/26/2022 04/26/2022   Renal cell carcinoma [facility])     Past Surgical History:  Procedure Laterality Date   CAROTID ENDARTERECTOMY Right    CORONARY ARTERY BYPASS GRAFT     NEPHRECTOMY Right    PACEMAKER IMPLANT N/A 04/26/2022   Procedure: PACEMAKER IMPLANT;  Surgeon: Marinus Maw, MD;  Location: [facility] INVASIVE CV LAB;  Service: Cardiovascular;  Laterality: N/A;   TEMPORARY PACEMAKER N/A 04/25/2022   Procedure: TEMPORARY PACEMAKER;  Surgeon: Elder Negus, MD;  Location: [facility]  INVASIVE CV LAB;  Service: Cardiovascular;  Laterality: N/A;    Current Outpatient Medications  Medication Sig Dispense Refill   acetaminophen (TYLENOL) 500 MG tablet Take 500 mg by mouth every 6 (six) hours as needed (joint pain).     beta carotene w/minerals (OCUVITE) tablet Take 1 tablet by mouth daily.     glimepiride (AMARYL) 2 MG tablet Take 2 mg by mouth daily before breakfast.     hydrALAZINE (APRESOLINE) 25 MG tablet Take 1 tablet (25 mg total) by mouth 3 (three) times daily. 90 tablet 2   isosorbide dinitrate (ISORDIL) 20 MG tablet Take 1 tablet (20 mg total) by mouth 3 (three) times daily. 90 tablet 2   latanoprost (XALATAN) 0.005 % ophthalmic solution Place 1 drop into both eyes at bedtime.     losartan-hydrochlorothiazide (HYZAAR) 100-12.5 MG tablet Take 1 tablet by mouth daily.     metformin (FORTAMET) 500 MG (OSM) 24 hr tablet Take 500 mg by mouth every evening.     metoprolol succinate (TOPROL XL) 25 MG 24 hr tablet Take 1 tablet (25 mg total) by mouth daily. 90 tablet 3   Multiple Vitamin (MULTIVITAMIN) tablet Take 1 tablet by mouth daily.     Omega-3 Fatty Acids (FISH OIL PO) Take by mouth 2 (two) times daily.     simvastatin (ZOCOR) 40 MG tablet Take 40 mg by mouth every evening.     timolol (TIMOPTIC) 0.25 % ophthalmic solution Place 1 drop  into the left eye every morning.     No current facility-administered medications for this visit.    Allergies  Allergen Reactions   Lipitor [Atorvastatin]     Cramps     Social History   Socioeconomic History   Marital status: Divorced    Spouse name: Not on file   Number of children: 1   Years of education: Not on file   Highest education level: Not on file  Occupational History   Occupation: Retired-Hardware business  Tobacco Use   Smoking status: Never   Smokeless tobacco: Not on file  Vaping Use   Vaping Use: Never used  Substance and Sexual Activity   Alcohol use: Not Currently    Comment: social   Drug use:  Not on file   Sexual activity: Not on file  Other Topics Concern   Not on file  Social History Narrative   Not on file   Social Determinants of Health   Financial Resource Strain: Not on file  Food Insecurity: No Food Insecurity (04/25/2022)   Hunger Vital Sign    Worried About Running Out of Food in the Last Year: Never true    Ran Out of Food in the Last Year: Never true  Transportation Needs: No Transportation Needs (04/25/2022)   PRAPARE - Administrator, Civil Service (Medical): No    Lack of Transportation (Non-Medical): No  Physical Activity: Not on file  Stress: Not on file  Social Connections: Not on file  Intimate Partner Violence: Not At Risk (04/25/2022)   Humiliation, Afraid, Rape, and Kick questionnaire    Fear of Current or Ex-Partner: No    Emotionally Abused: No    Physically Abused: No    Sexually Abused: No    Family History  Problem Relation Age of Onset   Stroke Father     Review of Systems:  As stated in the HPI and otherwise negative.   BP (!) 164/70   Pulse 61   Ht 5\' 7"  (1.702 m)   Wt 70 kg   SpO2 98%   BMI 24.18 kg/m   Physical Examination: General: Well developed, well nourished, NAD  HEENT: OP clear, mucus membranes moist  SKIN: warm, dry. No rashes. Neuro: No focal deficits  Musculoskeletal: Muscle strength 5/5 all ext  Psychiatric: Mood and affect normal  Neck: No JVD, no carotid bruits, no thyromegaly, no lymphadenopathy.  Lungs:Clear bilaterally, no wheezes, rhonci, crackles Cardiovascular: Regular rate and rhythm. No murmurs, gallops or rubs. Abdomen:Soft. Bowel sounds present. Non-tender.  Extremities: No lower extremity edema. Pulses are 2 + in the bilateral DP/PT.  EKG:  EKG is ordered today. The ekg ordered today demonstrates AV paced.   Echo March 2024:  1. Left ventricular ejection fraction, by estimation, is 45 to 50%. The  left ventricle has mildly decreased function. There is mild left  ventricular  hypertrophy. Left ventricular diastolic function could not be  evaluated. Left ventricular diastolic  function could not be evaluated due to paced rhythm. Mild septal  hypokinesis likely due to post-op septum.   2. Right ventricular systolic function is normal. The right ventricular  size is normal.   3. The mitral valve is degenerative. Mild mitral valve regurgitation. No  evidence of mitral stenosis.   4. The aortic valve is tricuspid. Aortic valve regurgitation is trivial.  Aortic valve sclerosis is present, with no evidence of aortic valve  stenosis.   5. The inferior vena cava is normal in size with  greater than 50%  respiratory variability, suggesting right atrial pressure of 3 mmHg.   6. Rhythm strip during this exam demonstrates a paced rhythm.   Recent Labs: 04/25/2022: TSH 5.554 04/26/2022: Magnesium 2.1 04/30/2022: BUN 29; Creatinine, Ser 1.58; Hemoglobin 13.5; Platelets 187; Potassium 4.2; Sodium 135   Lipid Panel    Component Value Date/Time   CHOL 112 04/26/2022 0619   TRIG 127 04/26/2022 0619   HDL 33 (L) 04/26/2022 0619   CHOLHDL 3.4 04/26/2022 0619   VLDL 25 04/26/2022 0619   LDLCALC 54 04/26/2022 0619   LDLDIRECT 55 04/26/2022 0619     Wt Readings from Last 3 Encounters:  07/07/22 70 kg  05/11/22 69.4 kg  04/30/22 70.1 kg    Assessment and Plan:   1. CAD s/p CABG without angina: No angina. Will restart ASA 81 mg daily. Continue statin, beta blocker and Imdur.   2. Complete heart block s/p pacemaker: Followed in EP by Dr. Ladona Ridgel  3. Ischemic cardiomyopathy: LVEF=45-50% by recent echo in March 2024. Continue Toprol and Losartan. I will not change his Losartan to [facility] given his borderline renal function with one kidney. We could consider Entresto in the future along with aldactone but he does not wish to make these changes today  4. HTN: BP is elevated today but has been controlled at home. Will restart Toprol 25 mg po daily.   5. Hyperlipidemia: LDL 55  in March 2024. Continue statin.   6. Carotid artery disease: He is s/p right carotid endarterectomy. Will arrange carotid artery dopplers now.   Labs/ tests ordered today include:   Orders Placed This Encounter  Procedures   EKG 12-Lead   VAS US CAROTID   Disposition:   F/U with me in one year   Signed, Verne Carrow, MD, [facility] 07/07/2022 4:32 PM    [facility] [address] [place], [place], [place]  [address] Phone: [phone]; Fax: [phone]

## 2022-07-07 NOTE — Patient Instructions (Signed)
Medication Instructions:  Your physician has recommended you make the following change in your medication:  1.) metoprolol succinate (Toprol XL) 25 mg - take one tablet daily 2.) aspirin 81 mg - take one tablet daily  *If you need a refill on your cardiac medications before your next appointment, please call your pharmacy*   Lab Work: NONE   Testing/Procedures: Your physician has requested that you have a carotid duplex. This test is an ultrasound of the carotid arteries in your neck. It looks at blood flow through these arteries that supply the brain with blood. Allow one hour for this exam. There are no restrictions or special instructions.   Follow-Up: At [facility], you and your health needs are our priority.  As part of our continuing mission to provide you with exceptional heart care, we have created designated Provider Care Teams.  These Care Teams include your primary Cardiologist (physician) and Advanced Practice Providers (APPs -  Physician Assistants and Nurse Practitioners) who all work together to provide you with the care you need, when you need it.   Your next appointment:   12 month(s)  Provider:   Verne Carrow, MD

## 2022-07-15 DIAGNOSIS — I6529 Occlusion and stenosis of unspecified carotid artery: Secondary | ICD-10-CM | POA: Diagnosis not present

## 2022-07-15 DIAGNOSIS — I69354 Hemiplegia and hemiparesis following cerebral infarction affecting left non-dominant side: Secondary | ICD-10-CM | POA: Diagnosis not present

## 2022-07-15 DIAGNOSIS — I10 Essential (primary) hypertension: Secondary | ICD-10-CM | POA: Diagnosis not present

## 2022-07-15 DIAGNOSIS — I2510 Atherosclerotic heart disease of native coronary artery without angina pectoris: Secondary | ICD-10-CM | POA: Diagnosis not present

## 2022-07-15 DIAGNOSIS — E1122 Type 2 diabetes mellitus with diabetic chronic kidney disease: Secondary | ICD-10-CM | POA: Diagnosis not present

## 2022-07-15 DIAGNOSIS — I442 Atrioventricular block, complete: Secondary | ICD-10-CM | POA: Diagnosis not present

## 2022-07-15 DIAGNOSIS — N1832 Chronic kidney disease, stage 3b: Secondary | ICD-10-CM | POA: Diagnosis not present

## 2022-07-15 DIAGNOSIS — E782 Mixed hyperlipidemia: Secondary | ICD-10-CM | POA: Diagnosis not present

## 2022-07-15 DIAGNOSIS — I700 Atherosclerosis of aorta: Secondary | ICD-10-CM | POA: Diagnosis not present

## 2022-07-21 ENCOUNTER — Other Ambulatory Visit: Payer: Self-pay | Admitting: Cardiovascular Disease

## 2022-07-21 DIAGNOSIS — I6523 Occlusion and stenosis of bilateral carotid arteries: Secondary | ICD-10-CM

## 2022-07-21 DIAGNOSIS — I2510 Atherosclerotic heart disease of native coronary artery without angina pectoris: Secondary | ICD-10-CM

## 2022-07-21 DIAGNOSIS — N183 Chronic kidney disease, stage 3 unspecified: Secondary | ICD-10-CM

## 2022-07-21 DIAGNOSIS — I442 Atrioventricular block, complete: Secondary | ICD-10-CM

## 2022-07-21 DIAGNOSIS — E782 Mixed hyperlipidemia: Secondary | ICD-10-CM

## 2022-07-23 ENCOUNTER — Ambulatory Visit (HOSPITAL_COMMUNITY)
Admission: RE | Admit: 2022-07-23 | Discharge: 2022-07-23 | Disposition: A | Discharge status: Home / Self Care | Payer: Medicare HMO | Source: Ambulatory Visit | Attending: Internal Medicine | Admitting: Internal Medicine

## 2022-07-23 DIAGNOSIS — I6523 Occlusion and stenosis of bilateral carotid arteries: Secondary | ICD-10-CM | POA: Diagnosis not present

## 2022-07-30 ENCOUNTER — Encounter: Payer: Medicare HMO | Admitting: Internal Medicine

## 2022-07-30 ENCOUNTER — Encounter: Payer: Self-pay | Admitting: Internal Medicine

## 2022-07-30 ENCOUNTER — Ambulatory Visit: Payer: Medicare HMO | Attending: Internal Medicine | Admitting: Internal Medicine

## 2022-07-30 VITALS — BP 112/62 | HR 60 | Ht 67.0 in | Wt 151.2 lb

## 2022-07-30 DIAGNOSIS — I442 Atrioventricular block, complete: Secondary | ICD-10-CM | POA: Diagnosis not present

## 2022-07-30 DIAGNOSIS — Z950 Presence of cardiac pacemaker: Secondary | ICD-10-CM | POA: Diagnosis not present

## 2022-07-30 NOTE — Progress Notes (Signed)
HPI Mr. Timbers returns today for followup. He is a pleasant 87 yo man with CAD,s /p CABG, HTN, and a remote stroke who developed CHB and underwent PPM about 3 months ago. In the interim he notes he is feeling well except for chronic knee pain.  Allergies  Allergen Reactions   Lipitor [Atorvastatin]     Cramps      Current Outpatient Medications  Medication Sig Dispense Refill   acetaminophen (TYLENOL) 500 MG tablet Take 500 mg by mouth every 6 (six) hours as needed (joint pain).     beta carotene w/minerals (OCUVITE) tablet Take 1 tablet by mouth daily.     glimepiride (AMARYL) 2 MG tablet Take 2 mg by mouth daily before breakfast.     hydrALAZINE (APRESOLINE) 25 MG tablet Take 1 tablet (25 mg total) by mouth 3 (three) times daily. 90 tablet 2   isosorbide dinitrate (ISORDIL) 20 MG tablet Take 1 tablet (20 mg total) by mouth 3 (three) times daily. 90 tablet 2   latanoprost (XALATAN) 0.005 % ophthalmic solution Place 1 drop into both eyes at bedtime.     losartan-hydrochlorothiazide (HYZAAR) 100-12.5 MG tablet Take 1 tablet by mouth daily.     metformin (FORTAMET) 500 MG (OSM) 24 hr tablet Take 500 mg by mouth every evening.     metoprolol succinate (TOPROL XL) 25 MG 24 hr tablet Take 1 tablet (25 mg total) by mouth daily. 90 tablet 3   Multiple Vitamin (MULTIVITAMIN) tablet Take 1 tablet by mouth daily.     Omega-3 Fatty Acids (FISH OIL PO) Take by mouth 2 (two) times daily.     simvastatin (ZOCOR) 40 MG tablet Take 40 mg by mouth every evening.     timolol (TIMOPTIC) 0.25 % ophthalmic solution Place 1 drop into the left eye every morning.     No current facility-administered medications for this visit.     Past Medical History:  Diagnosis Date   Carotid artery disease (HCC)    Coronary artery disease    Diabetes mellitus    Encounter for care of pacemaker 04/27/2022   H/O: CVA (cerebrovascular accident)    Post right CEA   Heart block AV complete (HCC) 04/25/2022    Hyperlipidemia    Hypertension    Pacemaker St. Jude Assurity Dr-Rf - [id number] dual-chamber pacemaker 04/26/2022 04/26/2022   Renal cell carcinoma (HCC)     ROS:   All systems reviewed and negative except as noted in the HPI.   Past Surgical History:  Procedure Laterality Date   CAROTID ENDARTERECTOMY Right    CORONARY ARTERY BYPASS GRAFT     NEPHRECTOMY Right    PACEMAKER IMPLANT N/A 04/26/2022   Procedure: PACEMAKER IMPLANT;  Surgeon: Marinus Maw, MD;  Location: [facility] INVASIVE CV LAB;  Service: Cardiovascular;  Laterality: N/A;   TEMPORARY PACEMAKER N/A 04/25/2022   Procedure: TEMPORARY PACEMAKER;  Surgeon: Elder Negus, MD;  Location: [facility] INVASIVE CV LAB;  Service: Cardiovascular;  Laterality: N/A;     Family History  Problem Relation Age of Onset   Stroke Father      Social History   Socioeconomic History   Marital status: Divorced    Spouse name: Not on file   Number of children: 1   Years of education: Not on file   Highest education level: Not on file  Occupational History   Occupation: Retired-Hardware business  Tobacco Use   Smoking status: Never   Smokeless tobacco: Not on file  Vaping Use   Vaping Use: Never used  Substance and Sexual Activity   Alcohol use: Not Currently    Comment: social   Drug use: Not on file   Sexual activity: Not on file  Other Topics Concern   Not on file  Social History Narrative   Not on file   Social Determinants of Health   Financial Resource Strain: Not on file  Food Insecurity: No Food Insecurity (04/25/2022)   Hunger Vital Sign    Worried About Running Out of Food in the Last Year: Never true    Ran Out of Food in the Last Year: Never true  Transportation Needs: No Transportation Needs (04/25/2022)   PRAPARE - Administrator, Civil Service (Medical): No    Lack of Transportation (Non-Medical): No  Physical Activity: Not on file  Stress: Not on file  Social Connections: Not on file  Intimate  Partner Violence: Not At Risk (04/25/2022)   Humiliation, Afraid, Rape, and Kick questionnaire    Fear of Current or Ex-Partner: No    Emotionally Abused: No    Physically Abused: No    Sexually Abused: No     BP 112/62   Pulse 60   Ht 5\' 7"  (1.702 m)   Wt 151 lb 3.2 oz (68.6 kg)   SpO2 97%   BMI 23.68 kg/m   Physical Exam:  Well appearing NAD HEENT: Unremarkable Neck:  No JVD, no thyromegally Lymphatics:  No adenopathy Back:  No CVA tenderness Lungs:  Clear HEART:  Regular rate rhythm, no murmurs, no rubs, no clicks Abd:  soft, positive bowel sounds, no organomegally, no rebound, no guarding Ext:  2 plus pulses, no edema, no cyanosis, no clubbing Skin:  No rashes no nodules Neuro:  CN II through XII intact, motor grossly intact  EKG - NSR with P synchronous ventricular pacing  DEVICE  Normal device function.  See PaceArt for details.   Assess/Plan: CHB - he is dependent down to 30 today. He is asymptomatic s/p PPM insertion. CAD, s/p CABG - he denies anginal symptoms.   Sharlot Gowda Oliveah Zwack,MD

## 2022-07-30 NOTE — Patient Instructions (Signed)
Medication Instructions:  Your physician recommends that you continue on your current medications as directed. Please refer to the Current Medication list given to you today.  *If you need a refill on your cardiac medications before your next appointment, please call your pharmacy*   Follow-Up: At [facility], you and your health needs are our priority.  As part of our continuing mission to provide you with exceptional heart care, we have created designated Provider Care Teams.  These Care Teams include your primary Cardiologist (physician) and Advanced Practice Providers (APPs -  Physician Assistants and Nurse Practitioners) who all work together to provide you with the care you need, when you need it.  Your next appointment:   1 year(s)  Provider:   You may see Gregg Taylor, MD or one of the following Advanced Practice Providers on your designated Care Team:   Renee Ursuy, PA-C Michael "Andy" Tillery, PA-C Suzann Riddle, NP    

## 2022-08-01 ENCOUNTER — Encounter: Payer: Self-pay | Admitting: Cardiology

## 2022-08-05 ENCOUNTER — Ambulatory Visit: Payer: Medicare HMO | Admitting: Cardiology

## 2022-09-28 DIAGNOSIS — H401122 Primary open-angle glaucoma, left eye, moderate stage: Secondary | ICD-10-CM | POA: Diagnosis not present

## 2022-10-29 DIAGNOSIS — Z45018 Encounter for adjustment and management of other part of cardiac pacemaker: Secondary | ICD-10-CM | POA: Diagnosis not present

## 2022-10-29 DIAGNOSIS — I442 Atrioventricular block, complete: Secondary | ICD-10-CM | POA: Diagnosis not present

## 2022-12-21 DIAGNOSIS — Z0000 Encounter for general adult medical examination without abnormal findings: Secondary | ICD-10-CM | POA: Diagnosis not present

## 2022-12-21 DIAGNOSIS — E1122 Type 2 diabetes mellitus with diabetic chronic kidney disease: Secondary | ICD-10-CM | POA: Diagnosis not present

## 2022-12-21 DIAGNOSIS — I6529 Occlusion and stenosis of unspecified carotid artery: Secondary | ICD-10-CM | POA: Diagnosis not present

## 2022-12-21 DIAGNOSIS — I442 Atrioventricular block, complete: Secondary | ICD-10-CM | POA: Diagnosis not present

## 2022-12-21 DIAGNOSIS — E782 Mixed hyperlipidemia: Secondary | ICD-10-CM | POA: Diagnosis not present

## 2022-12-21 DIAGNOSIS — I2510 Atherosclerotic heart disease of native coronary artery without angina pectoris: Secondary | ICD-10-CM | POA: Diagnosis not present

## 2022-12-21 DIAGNOSIS — I700 Atherosclerosis of aorta: Secondary | ICD-10-CM | POA: Diagnosis not present

## 2022-12-21 DIAGNOSIS — N1832 Chronic kidney disease, stage 3b: Secondary | ICD-10-CM | POA: Diagnosis not present

## 2022-12-21 DIAGNOSIS — I10 Essential (primary) hypertension: Secondary | ICD-10-CM | POA: Diagnosis not present

## 2023-01-28 ENCOUNTER — Ambulatory Visit: Payer: Medicare HMO

## 2023-01-28 DIAGNOSIS — I442 Atrioventricular block, complete: Secondary | ICD-10-CM | POA: Diagnosis not present

## 2023-01-29 LAB — CUP PACEART REMOTE DEVICE CHECK
Battery Remaining Longevity: 107 mo
Battery Remaining Percentage: 93 %
Battery Voltage: 2.99 V
Brady Statistic AP VP Percent: 43 %
Brady Statistic AP VS Percent: 1 %
Brady Statistic AS VP Percent: 55 %
Brady Statistic AS VS Percent: 1 %
Brady Statistic RA Percent Paced: 41 %
Brady Statistic RV Percent Paced: 98 %
Date Time Interrogation Session: 20241213020015
Implantable Lead Connection Status: 753985
Implantable Lead Connection Status: 753985
Implantable Lead Implant Date: 20240311
Implantable Lead Implant Date: 20240311
Implantable Lead Location: 753859
Implantable Lead Location: 753860
Implantable Pulse Generator Implant Date: 20240311
Lead Channel Impedance Value: 300 Ohm
Lead Channel Impedance Value: 530 Ohm
Lead Channel Pacing Threshold Amplitude: 0.75 V
Lead Channel Pacing Threshold Amplitude: 0.75 V
Lead Channel Pacing Threshold Pulse Width: 0.5 ms
Lead Channel Pacing Threshold Pulse Width: 0.5 ms
Lead Channel Sensing Intrinsic Amplitude: 12 mV
Lead Channel Sensing Intrinsic Amplitude: 2 mV
Lead Channel Setting Pacing Amplitude: 1 V
Lead Channel Setting Pacing Amplitude: 2 V
Lead Channel Setting Pacing Pulse Width: 0.5 ms
Lead Channel Setting Sensing Sensitivity: 2 mV
Pulse Gen Model: 2272
Pulse Gen Serial Number: 8163669

## 2023-03-03 NOTE — Addendum Note (Signed)
Addended by: Elease Etienne A on: 03/03/2023 06:01 PM   Modules accepted: Orders

## 2023-03-03 NOTE — Progress Notes (Signed)
Remote pacemaker transmission.   

## 2023-04-04 DIAGNOSIS — E1122 Type 2 diabetes mellitus with diabetic chronic kidney disease: Secondary | ICD-10-CM | POA: Diagnosis not present

## 2023-04-29 ENCOUNTER — Ambulatory Visit

## 2023-04-29 DIAGNOSIS — I442 Atrioventricular block, complete: Secondary | ICD-10-CM

## 2023-05-02 ENCOUNTER — Encounter: Payer: Self-pay | Admitting: Internal Medicine

## 2023-05-02 LAB — CUP PACEART REMOTE DEVICE CHECK
Battery Remaining Longevity: 101 mo
Battery Remaining Percentage: 90 %
Battery Voltage: 2.99 V
Brady Statistic AP VP Percent: 42 %
Brady Statistic AP VS Percent: 1 %
Brady Statistic AS VP Percent: 56 %
Brady Statistic AS VS Percent: 1 %
Brady Statistic RA Percent Paced: 40 %
Brady Statistic RV Percent Paced: 98 %
Date Time Interrogation Session: 20250314020013
Implantable Lead Connection Status: 753985
Implantable Lead Connection Status: 753985
Implantable Lead Implant Date: 20240311
Implantable Lead Implant Date: 20240311
Implantable Lead Location: 753859
Implantable Lead Location: 753860
Implantable Pulse Generator Implant Date: 20240311
Lead Channel Impedance Value: 280 Ohm
Lead Channel Impedance Value: 480 Ohm
Lead Channel Pacing Threshold Amplitude: 0.75 V
Lead Channel Pacing Threshold Amplitude: 0.875 V
Lead Channel Pacing Threshold Pulse Width: 0.5 ms
Lead Channel Pacing Threshold Pulse Width: 0.5 ms
Lead Channel Sensing Intrinsic Amplitude: 1.9 mV
Lead Channel Sensing Intrinsic Amplitude: 12 mV
Lead Channel Setting Pacing Amplitude: 1.125
Lead Channel Setting Pacing Amplitude: 2 V
Lead Channel Setting Pacing Pulse Width: 0.5 ms
Lead Channel Setting Sensing Sensitivity: 2 mV
Pulse Gen Model: 2272
Pulse Gen Serial Number: 8163669

## 2023-06-09 NOTE — Progress Notes (Signed)
 Remote pacemaker transmission.

## 2023-06-09 NOTE — Addendum Note (Signed)
 Addended by: Lott Rouleau A on: 06/09/2023 11:03 AM   Modules accepted: Orders

## 2023-06-16 DIAGNOSIS — Z6824 Body mass index (BMI) 24.0-24.9, adult: Secondary | ICD-10-CM | POA: Diagnosis not present

## 2023-06-16 DIAGNOSIS — E782 Mixed hyperlipidemia: Secondary | ICD-10-CM | POA: Diagnosis not present

## 2023-06-16 DIAGNOSIS — I442 Atrioventricular block, complete: Secondary | ICD-10-CM | POA: Diagnosis not present

## 2023-06-16 DIAGNOSIS — I10 Essential (primary) hypertension: Secondary | ICD-10-CM | POA: Diagnosis not present

## 2023-06-16 DIAGNOSIS — E1122 Type 2 diabetes mellitus with diabetic chronic kidney disease: Secondary | ICD-10-CM | POA: Diagnosis not present

## 2023-06-16 DIAGNOSIS — I2510 Atherosclerotic heart disease of native coronary artery without angina pectoris: Secondary | ICD-10-CM | POA: Diagnosis not present

## 2023-06-16 DIAGNOSIS — R262 Difficulty in walking, not elsewhere classified: Secondary | ICD-10-CM | POA: Diagnosis not present

## 2023-06-16 DIAGNOSIS — N1832 Chronic kidney disease, stage 3b: Secondary | ICD-10-CM | POA: Diagnosis not present

## 2023-06-16 DIAGNOSIS — I69354 Hemiplegia and hemiparesis following cerebral infarction affecting left non-dominant side: Secondary | ICD-10-CM | POA: Diagnosis not present

## 2023-06-16 LAB — LAB REPORT - SCANNED
A1c: 7.7
Creatinine, POC: 535 mg/dL
EGFR: 34
Microalb Creat Ratio: 42.1

## 2023-06-21 DIAGNOSIS — H401122 Primary open-angle glaucoma, left eye, moderate stage: Secondary | ICD-10-CM | POA: Diagnosis not present

## 2023-07-26 ENCOUNTER — Encounter: Admitting: Student

## 2023-08-08 ENCOUNTER — Ambulatory Visit: Attending: Student | Admitting: Student

## 2023-08-08 ENCOUNTER — Encounter: Payer: Self-pay | Admitting: Student

## 2023-08-08 VITALS — BP 88/56 | HR 62 | Ht 67.0 in | Wt 165.0 lb

## 2023-08-08 DIAGNOSIS — I442 Atrioventricular block, complete: Secondary | ICD-10-CM | POA: Diagnosis not present

## 2023-08-08 DIAGNOSIS — I2510 Atherosclerotic heart disease of native coronary artery without angina pectoris: Secondary | ICD-10-CM | POA: Diagnosis not present

## 2023-08-08 LAB — CUP PACEART INCLINIC DEVICE CHECK
Battery Remaining Longevity: 99 mo
Battery Voltage: 2.99 V
Brady Statistic RA Percent Paced: 43 %
Brady Statistic RV Percent Paced: 98 %
Date Time Interrogation Session: 20250623125752
Implantable Lead Connection Status: 753985
Implantable Lead Connection Status: 753985
Implantable Lead Implant Date: 20240311
Implantable Lead Implant Date: 20240311
Implantable Lead Location: 753859
Implantable Lead Location: 753860
Implantable Pulse Generator Implant Date: 20240311
Lead Channel Impedance Value: 275 Ohm
Lead Channel Impedance Value: 512.5 Ohm
Lead Channel Pacing Threshold Amplitude: 0.75 V
Lead Channel Pacing Threshold Amplitude: 0.75 V
Lead Channel Pacing Threshold Amplitude: 0.875 V
Lead Channel Pacing Threshold Pulse Width: 0.5 ms
Lead Channel Pacing Threshold Pulse Width: 0.5 ms
Lead Channel Pacing Threshold Pulse Width: 0.5 ms
Lead Channel Sensing Intrinsic Amplitude: 12 mV
Lead Channel Sensing Intrinsic Amplitude: 2.2 mV
Lead Channel Setting Pacing Amplitude: 1.125
Lead Channel Setting Pacing Amplitude: 2 V
Lead Channel Setting Pacing Pulse Width: 0.5 ms
Lead Channel Setting Sensing Sensitivity: 2 mV
Pulse Gen Model: 2272
Pulse Gen Serial Number: 8163669

## 2023-08-08 NOTE — Patient Instructions (Signed)
 Medication Instructions:  Your physician recommends that you continue on your current medications as directed. Please refer to the Current Medication list given to you today.  *If you need a refill on your cardiac medications before your next appointment, please call your pharmacy*  Lab Work: None ordered If you have labs (blood work) drawn today and your tests are completely normal, you will receive your results only by: MyChart Message (if you have MyChart) OR A paper copy in the mail If you have any lab test that is abnormal or we need to change your treatment, we will call you to review the results.  Follow-Up: At [facility], you and your health needs are our priority.  As part of our continuing mission to provide you with exceptional heart care, our providers are all part of one team.  This team includes your primary Cardiologist (physician) and Advanced Practice Providers or APPs (Physician Assistants and Nurse Practitioners) who all work together to provide you with the care you need, when you need it.  Your next appointment:   1 year(s)  Provider:   You will see one of the following Advanced Practice Providers on your designated Care Team:   Mertha Abrahams, Kennard Pea [address] [place], PA-C Suzann Riddle, NP Creighton Doffing, NP

## 2023-08-08 NOTE — Progress Notes (Signed)
  Electrophysiology Office Note:   ID:  Scott Coleman, DOB 05/05/1935, MRN [id number]  Primary Cardiologist: Lonni Cash, MD Electrophysiologist: Danelle Birmingham, MD      History of Present Illness:   Scott Coleman is a 88 y.o. male with h/o CAD s/p CABG, HTN, remote CVA, and CHB s/p PPM seen today for routine electrophysiology followup.   Since last being seen in our clinic the patient reports doing OK from a cardiac perspective. This am though he is weak, diaphoretic, and dizzy. Has been in the bathroom several times with diarrhea, and also took his BP meds.  Has not had anything to eat.  Was USOH yesterday. No CP or SOB. No sick contact.  Review of systems complete and found to be negative unless listed in HPI.   EP Information / Studies Reviewed:    EKG is ordered today. Personal review as below.  EKG Interpretation Date/Time:  Monday August 08 2023 08:54:14 EDT Ventricular Rate:  62 PR Interval:  174 QRS Duration:  146 QT Interval:  466 QTC Calculation: 472 R Axis:   -39  Text Interpretation: AV dual-paced rhythm with occasional Premature ventricular complexes When compared with ECG of 27-Apr-2022 07:02, Premature ventricular complexes are now Present Vent. rate has decreased BY   7 BPM Confirmed by Lesia Sharper [phone]) on 08/08/2023 9:02:02 AM    PPM Interrogation-  reviewed in detail today,  See PACEART report.  Arrhythmia/Device History Abbott Dual Chamber PPM 04/2022 for CHB   Physical Exam:   VS:  BP (!) 88/56   Pulse 62   Ht 5' 7 (1.702 m)   Wt 165 lb (74.8 kg) Comment: Per pt report  SpO2 94%   BMI 25.84 kg/m    Wt Readings from Last 3 Encounters:  08/08/23 165 lb (74.8 kg)  07/30/22 151 lb 3.2 oz (68.6 kg)  07/07/22 154 lb 6.4 oz (70 kg)     GEN: Diaphoretic, Clammy.  NECK: No JVD; No carotid bruits CARDIAC: Regular rate and rhythm, no murmurs, rubs, gallops RESPIRATORY:  Clear to auscultation without rales, wheezing or rhonchi  ABDOMEN: Soft,  non-tender, non-distended EXTREMITIES:  No edema; No deformity   ASSESSMENT AND PLAN:    CHB s/p Abbott PPM  Normal PPM function See Pace Art report No changes today  CAD No s/s of ischemia.      GI illness With diarrhea this am, hypotensino, and orthostatic like symptoms getting out of the car.  Encouraged hydration and to hold his hydralazine  the rest of the day.  If he is unable to keep food/drinks down, he may require IV fluids in the ED. Alarm symptoms given.  Should make PCP appt if symptoms persist (or ED as above)   Disposition:   Follow up with EP Team in 12 months  Signed, Sharper Prentice Lesia, PA-C

## 2023-09-23 ENCOUNTER — Ambulatory Visit: Attending: Cardiovascular Disease | Admitting: Cardiovascular Disease

## 2023-09-23 ENCOUNTER — Encounter: Payer: Self-pay | Admitting: Cardiovascular Disease

## 2023-09-23 VITALS — BP 134/78 | HR 66 | Ht 67.0 in | Wt 159.0 lb

## 2023-09-23 DIAGNOSIS — N183 Chronic kidney disease, stage 3 unspecified: Secondary | ICD-10-CM

## 2023-09-23 DIAGNOSIS — I6523 Occlusion and stenosis of bilateral carotid arteries: Secondary | ICD-10-CM

## 2023-09-23 DIAGNOSIS — I2510 Atherosclerotic heart disease of native coronary artery without angina pectoris: Secondary | ICD-10-CM

## 2023-09-23 DIAGNOSIS — I442 Atrioventricular block, complete: Secondary | ICD-10-CM

## 2023-09-23 DIAGNOSIS — I255 Ischemic cardiomyopathy: Secondary | ICD-10-CM | POA: Diagnosis not present

## 2023-09-23 DIAGNOSIS — I129 Hypertensive chronic kidney disease with stage 1 through stage 4 chronic kidney disease, or unspecified chronic kidney disease: Secondary | ICD-10-CM | POA: Diagnosis not present

## 2023-09-23 DIAGNOSIS — E782 Mixed hyperlipidemia: Secondary | ICD-10-CM | POA: Diagnosis not present

## 2023-09-23 NOTE — Progress Notes (Signed)
 Chief Complaint  Patient presents with   Follow-up    CAD   History of Present Illness: 88 yo male with history of HTN, HLD, DM, prostate cancer, prior CVA, carotid artery disease s/p right carotid endarterectomy, renal cell carcinoma s/p right nephrectomy, CAD s/p 5V CABG in 2012, ischemic cardiomyopathy and complete heart block s/p pacemaker who is here today for cardiac follow up. He was admitted to [facility] in March 2024 after being found on the floor with waxing and waning consciousness by his daughter. EMS arrived and he was in complete heart block. Emergent temporary pacemaker placed and permanent pacemaker placed prior to discharge. He had 5V CABG in 2012 at [facility]. Echo March 2024 with LVEF=45-50%. Mild LVH. Mild MR.   He is here today for follow up. The patient denies any chest pain, dyspnea, palpitations, lower extremity edema, orthopnea, PND, dizziness, near syncope or syncope.   Primary Care Physician: Nanci Senior, MD   Past Medical History:  Diagnosis Date   Carotid artery disease [facility])    Coronary artery disease    Diabetes mellitus    Encounter for care of pacemaker 04/27/2022   H/O: CVA (cerebrovascular accident)    Post right CEA   Heart block AV complete (HCC) 04/25/2022   Hyperlipidemia    Hypertension    Pacemaker [employer]-R[employer] - [id number] dual-chamber pacemaker 04/26/2022 04/26/2022   Renal cell carcinoma [facility])     Past Surgical History:  Procedure Laterality Date   CAROTID ENDARTERECTOMY Right    CORONARY ARTERY BYPASS GRAFT     NEPHRECTOMY Right    PACEMAKER IMPLANT N/A 04/26/2022   Procedure: PACEMAKER IMPLANT;  Surgeon: Waddell Danelle ORN, MD;  Location: [facility] INVASIVE CV LAB;  Service: Cardiovascular;  Laterality: N/A;   TEMPORARY PACEMAKER N/A 04/25/2022   Procedure: TEMPORARY PACEMAKER;  Surgeon: Elmira Newman PARAS, MD;  Location: [facility] INVASIVE CV LAB;  Service: Cardiovascular;  Laterality: N/A;    Current Outpatient Medications  Medication Sig  Dispense Refill   acetaminophen  (TYLENOL ) 500 MG tablet Take 500 mg by mouth every 6 (six) hours as needed (joint pain).     beta carotene w/minerals (OCUVITE) tablet Take 1 tablet by mouth daily.     glimepiride (AMARYL) 2 MG tablet Take 2 mg by mouth daily before breakfast.     hydrALAZINE  (APRESOLINE ) 25 MG tablet Take 1 tablet (25 mg total) by mouth 3 (three) times daily. 90 tablet 2   isosorbide  dinitrate (ISORDIL ) 20 MG tablet Take 1 tablet (20 mg total) by mouth 3 (three) times daily. 90 tablet 2   latanoprost (XALATAN) 0.005 % ophthalmic solution Place 1 drop into both eyes at bedtime.     losartan  (COZAAR ) 100 MG tablet Take 100 mg by mouth daily.     metformin (FORTAMET) 500 MG (OSM) 24 hr tablet Take 500 mg by mouth every evening.     metoprolol  succinate (TOPROL  XL) 25 MG 24 hr tablet Take 1 tablet (25 mg total) by mouth daily. 90 tablet 3   Multiple Vitamin (MULTIVITAMIN) tablet Take 1 tablet by mouth daily.     Omega-3 Fatty Acids (FISH OIL PO) Take by mouth 2 (two) times daily.     pioglitazone (ACTOS) 30 MG tablet Take 30 mg by mouth daily.     simvastatin  (ZOCOR ) 40 MG tablet Take 40 mg by mouth every evening.     timolol (TIMOPTIC) 0.25 % ophthalmic solution Place 1 drop into the left eye every morning.  No current facility-administered medications for this visit.    Allergies  Allergen Reactions   Lipitor [Atorvastatin]     Cramps     Social History   Socioeconomic History   Marital status: Divorced    Spouse name: Not on file   Number of children: 1   Years of education: Not on file   Highest education level: Not on file  Occupational History   Occupation: Retired-Hardware business  Tobacco Use   Smoking status: Never   Smokeless tobacco: Not on file  Vaping Use   Vaping status: Never Used  Substance and Sexual Activity   Alcohol use: Not Currently    Comment: social   Drug use: Not on file   Sexual activity: Not on file  Other Topics Concern    Not on file  Social History Narrative   Not on file   Social Drivers of Health   Financial Resource Strain: Not on file  Food Insecurity: No Food Insecurity (04/25/2022)   Hunger Vital Sign    Worried About Running Out of Food in the Last Year: Never true    Ran Out of Food in the Last Year: Never true  Transportation Needs: No Transportation Needs (04/25/2022)   PRAPARE - Administrator, Civil Service (Medical): No    Lack of Transportation (Non-Medical): No  Physical Activity: Not on file  Stress: Not on file  Social Connections: Not on file  Intimate Partner Violence: Not At Risk (04/25/2022)   Humiliation, Afraid, Rape, and Kick questionnaire    Fear of Current or Ex-Partner: No    Emotionally Abused: No    Physically Abused: No    Sexually Abused: No    Family History  Problem Relation Age of Onset   Stroke Father     Review of Systems:  As stated in the HPI and otherwise negative.   BP 134/78   Pulse 66   Ht 5' 7 (1.702 m)   Wt 159 lb (72.1 kg)   SpO2 99%   BMI 24.90 kg/m   Physical Examination:  General: Well developed, well nourished, NAD  HEENT: OP clear, mucus membranes moist  SKIN: warm, dry. No rashes. Neuro: No focal deficits  Musculoskeletal: Muscle strength 5/5 all ext  Psychiatric: Mood and affect normal  Neck: No JVD, no carotid bruits, no thyromegaly, no lymphadenopathy.  Lungs:Clear bilaterally, no wheezes, rhonci, crackles Cardiovascular: Regular rate and rhythm. No murmurs, gallops or rubs. Abdomen:Soft. Bowel sounds present. Non-tender.  Extremities: No lower extremity edema. Pulses are 2 + in the bilateral DP/PT.  EKG:  EKG is not ordered today. The ekg ordered today demonstrates   Echo March 2024:  1. Left ventricular ejection fraction, by estimation, is 45 to 50%. The  left ventricle has mildly decreased function. There is mild left  ventricular hypertrophy. Left ventricular diastolic function could not be  evaluated.  Left ventricular diastolic  function could not be evaluated due to paced rhythm. Mild septal  hypokinesis likely due to post-op septum.   2. Right ventricular systolic function is normal. The right ventricular  size is normal.   3. The mitral valve is degenerative. Mild mitral valve regurgitation. No  evidence of mitral stenosis.   4. The aortic valve is tricuspid. Aortic valve regurgitation is trivial.  Aortic valve sclerosis is present, with no evidence of aortic valve  stenosis.   5. The inferior vena cava is normal in size with greater than 50%  respiratory variability, suggesting right atrial  pressure of 3 mmHg.   6. Rhythm strip during this exam demonstrates a paced rhythm.   Recent Labs: No results found for requested labs within last 365 days.   Lipid Panel    Component Value Date/Time   CHOL 112 04/26/2022 0619   TRIG 127 04/26/2022 0619   HDL 33 (L) 04/26/2022 0619   CHOLHDL 3.4 04/26/2022 0619   VLDL 25 04/26/2022 0619   LDLCALC 54 04/26/2022 0619   LDLDIRECT 55 04/26/2022 0619     Wt Readings from Last 3 Encounters:  09/23/23 159 lb (72.1 kg)  08/08/23 165 lb (74.8 kg)  07/30/22 151 lb 3.2 oz (68.6 kg)    Assessment and Plan:   1. CAD s/p CABG without angina: No chest pain. Continue ASA, beta blocker, statin and Imdur. .   2. Complete heart block s/p pacemaker: Followed in EP by Dr. Waddell  3. Ischemic cardiomyopathy: LVEF=45-50% by echo in March 2024. I will not change his Losartan  to Entresto given his borderline renal function with one kidney. Continue Toprol  and Losartan   4. HTN: BP is well controlled today  5. Hyperlipidemia: LDL 61 in May 2025. Continue statin  6. Carotid artery disease: He is s/p right carotid endarterectomy. Mild carotid artery disease by dopplers June 2024.    Labs/ tests ordered today include:   No orders of the defined types were placed in this encounter.  Disposition:   F/U with me in one year   Signed, Lonni Cash, MD, [facility] 09/23/2023 9:27 AM    [facility] [address] [place], [place], [place]  [address] Phone: [phone]; Fax: [phone]

## 2023-09-23 NOTE — Patient Instructions (Signed)
 Medication Instructions:  No medication changes were made at this visit. Continue current regimen.   Follow-Up: At [facility], you and your health needs are our priority.  As part of our continuing mission to provide you with exceptional heart care, our providers are all part of one team.  This team includes your primary Cardiologist (physician) and Advanced Practice Providers or APPs (Physician Assistants and Nurse Practitioners) who all work together to provide you with the care you need, when you need it.  Your next appointment:   1 year(s)  Provider:   Lonni Cash, MD

## 2023-10-28 ENCOUNTER — Ambulatory Visit

## 2023-10-28 DIAGNOSIS — I442 Atrioventricular block, complete: Secondary | ICD-10-CM | POA: Diagnosis not present

## 2023-10-28 LAB — CUP PACEART REMOTE DEVICE CHECK
Battery Remaining Longevity: 92 mo
Battery Remaining Percentage: 85 %
Battery Voltage: 2.99 V
Brady Statistic AP VP Percent: 52 %
Brady Statistic AP VS Percent: 1 %
Brady Statistic AS VP Percent: 46 %
Brady Statistic AS VS Percent: 1 %
Brady Statistic RA Percent Paced: 49 %
Brady Statistic RV Percent Paced: 98 %
Date Time Interrogation Session: 20250912020020
Implantable Lead Connection Status: 753985
Implantable Lead Connection Status: 753985
Implantable Lead Implant Date: 20240311
Implantable Lead Implant Date: 20240311
Implantable Lead Location: 753859
Implantable Lead Location: 753860
Implantable Pulse Generator Implant Date: 20240311
Lead Channel Impedance Value: 260 Ohm
Lead Channel Impedance Value: 460 Ohm
Lead Channel Pacing Threshold Amplitude: 0.75 V
Lead Channel Pacing Threshold Amplitude: 0.875 V
Lead Channel Pacing Threshold Pulse Width: 0.5 ms
Lead Channel Pacing Threshold Pulse Width: 0.5 ms
Lead Channel Sensing Intrinsic Amplitude: 1.9 mV
Lead Channel Sensing Intrinsic Amplitude: 10.6 mV
Lead Channel Setting Pacing Amplitude: 1.125
Lead Channel Setting Pacing Amplitude: 2 V
Lead Channel Setting Pacing Pulse Width: 0.5 ms
Lead Channel Setting Sensing Sensitivity: 2 mV
Pulse Gen Model: 2272
Pulse Gen Serial Number: 8163669

## 2023-11-04 ENCOUNTER — Ambulatory Visit: Payer: Self-pay | Admitting: Internal Medicine

## 2023-11-04 NOTE — Progress Notes (Signed)
 Remote PPM Transmission

## 2023-11-07 NOTE — Progress Notes (Signed)
 Remote PPM Transmission

## 2023-12-28 DIAGNOSIS — I442 Atrioventricular block, complete: Secondary | ICD-10-CM | POA: Diagnosis not present

## 2023-12-28 DIAGNOSIS — N1832 Chronic kidney disease, stage 3b: Secondary | ICD-10-CM | POA: Diagnosis not present

## 2023-12-28 DIAGNOSIS — E782 Mixed hyperlipidemia: Secondary | ICD-10-CM | POA: Diagnosis not present

## 2023-12-28 DIAGNOSIS — E1122 Type 2 diabetes mellitus with diabetic chronic kidney disease: Secondary | ICD-10-CM | POA: Diagnosis not present

## 2023-12-28 DIAGNOSIS — R262 Difficulty in walking, not elsewhere classified: Secondary | ICD-10-CM | POA: Diagnosis not present

## 2023-12-28 DIAGNOSIS — I10 Essential (primary) hypertension: Secondary | ICD-10-CM | POA: Diagnosis not present

## 2023-12-28 DIAGNOSIS — Z0000 Encounter for general adult medical examination without abnormal findings: Secondary | ICD-10-CM | POA: Diagnosis not present

## 2023-12-28 DIAGNOSIS — I69354 Hemiplegia and hemiparesis following cerebral infarction affecting left non-dominant side: Secondary | ICD-10-CM | POA: Diagnosis not present

## 2023-12-28 DIAGNOSIS — I2510 Atherosclerotic heart disease of native coronary artery without angina pectoris: Secondary | ICD-10-CM | POA: Diagnosis not present

## 2024-01-27 ENCOUNTER — Ambulatory Visit

## 2024-01-27 DIAGNOSIS — I442 Atrioventricular block, complete: Secondary | ICD-10-CM | POA: Diagnosis not present

## 2024-01-28 LAB — CUP PACEART REMOTE DEVICE CHECK
Battery Remaining Longevity: 90 mo
Battery Remaining Percentage: 82 %
Battery Voltage: 2.99 V
Brady Statistic AP VP Percent: 47 %
Brady Statistic AP VS Percent: 1 %
Brady Statistic AS VP Percent: 50 %
Brady Statistic AS VS Percent: 1 %
Brady Statistic RA Percent Paced: 44 %
Brady Statistic RV Percent Paced: 97 %
Date Time Interrogation Session: 20251212020012
Implantable Lead Connection Status: 753985
Implantable Lead Connection Status: 753985
Implantable Lead Implant Date: 20240311
Implantable Lead Implant Date: 20240311
Implantable Lead Location: 753859
Implantable Lead Location: 753860
Implantable Pulse Generator Implant Date: 20240311
Lead Channel Impedance Value: 260 Ohm
Lead Channel Impedance Value: 450 Ohm
Lead Channel Pacing Threshold Amplitude: 0.75 V
Lead Channel Pacing Threshold Amplitude: 0.75 V
Lead Channel Pacing Threshold Pulse Width: 0.5 ms
Lead Channel Pacing Threshold Pulse Width: 0.5 ms
Lead Channel Sensing Intrinsic Amplitude: 12 mV
Lead Channel Sensing Intrinsic Amplitude: 2 mV
Lead Channel Setting Pacing Amplitude: 1 V
Lead Channel Setting Pacing Amplitude: 2 V
Lead Channel Setting Pacing Pulse Width: 0.5 ms
Lead Channel Setting Sensing Sensitivity: 2 mV
Pulse Gen Model: 2272
Pulse Gen Serial Number: 8163669

## 2024-01-29 ENCOUNTER — Ambulatory Visit: Payer: Self-pay | Admitting: Internal Medicine

## 2024-02-03 NOTE — Progress Notes (Signed)
 Remote PPM Transmission

## 2024-04-27 ENCOUNTER — Encounter

## 2024-07-27 ENCOUNTER — Encounter

## 2024-10-26 ENCOUNTER — Encounter
